# Patient Record
Sex: Female | Born: 1945 | Race: White | Hispanic: No | Marital: Married | Smoking: Never smoker
Health system: Southern US, Community
[De-identification: ages and names within clinical notes are randomized; demographics above are authoritative.]

## PROBLEM LIST (undated history)

## (undated) DIAGNOSIS — I10 Essential (primary) hypertension: Secondary | ICD-10-CM

## (undated) DIAGNOSIS — E669 Obesity, unspecified: Secondary | ICD-10-CM

## (undated) DIAGNOSIS — C4491 Basal cell carcinoma of skin, unspecified: Secondary | ICD-10-CM

## (undated) HISTORY — DX: Basal cell carcinoma of skin, unspecified: C44.91

## (undated) HISTORY — DX: Obesity, unspecified: E66.9

## (undated) HISTORY — DX: Essential (primary) hypertension: I10

---

## 1997-11-26 HISTORY — PX: CHOLECYSTECTOMY: SHX55

## 1998-06-22 ENCOUNTER — Other Ambulatory Visit: Admission: RE | Admit: 1998-06-22 | Discharge: 1998-06-22 | Payer: Self-pay | Admitting: Obstetrics and Gynecology

## 1999-01-11 ENCOUNTER — Encounter: Payer: Self-pay | Admitting: Obstetrics and Gynecology

## 1999-01-11 ENCOUNTER — Ambulatory Visit (HOSPITAL_COMMUNITY): Admission: RE | Admit: 1999-01-11 | Discharge: 1999-01-11 | Payer: Self-pay | Admitting: Obstetrics and Gynecology

## 2000-01-15 ENCOUNTER — Encounter: Payer: Self-pay | Admitting: Obstetrics and Gynecology

## 2000-01-15 ENCOUNTER — Ambulatory Visit (HOSPITAL_COMMUNITY): Admission: RE | Admit: 2000-01-15 | Discharge: 2000-01-15 | Payer: Self-pay | Admitting: Obstetrics and Gynecology

## 2001-03-06 ENCOUNTER — Encounter: Payer: Self-pay | Admitting: Obstetrics and Gynecology

## 2001-03-06 ENCOUNTER — Ambulatory Visit (HOSPITAL_COMMUNITY): Admission: RE | Admit: 2001-03-06 | Discharge: 2001-03-06 | Payer: Self-pay | Admitting: Obstetrics and Gynecology

## 2001-03-10 ENCOUNTER — Encounter: Payer: Self-pay | Admitting: Obstetrics and Gynecology

## 2001-03-10 ENCOUNTER — Encounter: Admission: RE | Admit: 2001-03-10 | Discharge: 2001-03-10 | Payer: Self-pay | Admitting: Obstetrics and Gynecology

## 2001-06-17 ENCOUNTER — Ambulatory Visit (HOSPITAL_COMMUNITY): Admission: RE | Admit: 2001-06-17 | Discharge: 2001-06-17 | Payer: Self-pay | Admitting: Obstetrics and Gynecology

## 2001-06-17 ENCOUNTER — Encounter (INDEPENDENT_AMBULATORY_CARE_PROVIDER_SITE_OTHER): Payer: Self-pay | Admitting: Specialist

## 2002-03-11 ENCOUNTER — Encounter: Payer: Self-pay | Admitting: Obstetrics and Gynecology

## 2002-03-11 ENCOUNTER — Ambulatory Visit (HOSPITAL_COMMUNITY): Admission: RE | Admit: 2002-03-11 | Discharge: 2002-03-11 | Payer: Self-pay | Admitting: Obstetrics and Gynecology

## 2003-03-31 ENCOUNTER — Ambulatory Visit (HOSPITAL_COMMUNITY): Admission: RE | Admit: 2003-03-31 | Discharge: 2003-03-31 | Payer: Self-pay | Admitting: Obstetrics and Gynecology

## 2003-03-31 ENCOUNTER — Encounter: Payer: Self-pay | Admitting: Obstetrics and Gynecology

## 2004-05-23 ENCOUNTER — Ambulatory Visit (HOSPITAL_COMMUNITY): Admission: RE | Admit: 2004-05-23 | Discharge: 2004-05-23 | Payer: Self-pay | Admitting: Obstetrics and Gynecology

## 2005-06-13 ENCOUNTER — Ambulatory Visit (HOSPITAL_COMMUNITY): Admission: RE | Admit: 2005-06-13 | Discharge: 2005-06-13 | Payer: Self-pay | Admitting: Obstetrics and Gynecology

## 2006-07-12 ENCOUNTER — Ambulatory Visit (HOSPITAL_COMMUNITY): Admission: RE | Admit: 2006-07-12 | Discharge: 2006-07-12 | Payer: Self-pay | Admitting: Obstetrics and Gynecology

## 2007-01-30 ENCOUNTER — Other Ambulatory Visit: Admission: RE | Admit: 2007-01-30 | Discharge: 2007-01-30 | Payer: Self-pay | Admitting: Family Medicine

## 2007-02-03 ENCOUNTER — Ambulatory Visit (HOSPITAL_COMMUNITY): Admission: RE | Admit: 2007-02-03 | Discharge: 2007-02-03 | Payer: Self-pay | Admitting: Family Medicine

## 2007-07-18 ENCOUNTER — Ambulatory Visit (HOSPITAL_COMMUNITY): Admission: RE | Admit: 2007-07-18 | Discharge: 2007-07-18 | Payer: Self-pay | Admitting: Family Medicine

## 2008-07-23 ENCOUNTER — Ambulatory Visit (HOSPITAL_COMMUNITY): Admission: RE | Admit: 2008-07-23 | Discharge: 2008-07-23 | Payer: Self-pay | Admitting: Family Medicine

## 2009-07-26 ENCOUNTER — Ambulatory Visit (HOSPITAL_COMMUNITY): Admission: RE | Admit: 2009-07-26 | Discharge: 2009-07-26 | Payer: Self-pay | Admitting: Family Medicine

## 2010-09-01 ENCOUNTER — Ambulatory Visit (HOSPITAL_COMMUNITY): Admission: RE | Admit: 2010-09-01 | Discharge: 2010-09-01 | Payer: Self-pay | Admitting: Family Medicine

## 2010-12-17 ENCOUNTER — Encounter: Payer: Self-pay | Admitting: Obstetrics and Gynecology

## 2010-12-20 ENCOUNTER — Other Ambulatory Visit: Payer: Self-pay | Admitting: Family Medicine

## 2010-12-20 ENCOUNTER — Other Ambulatory Visit
Admission: RE | Admit: 2010-12-20 | Discharge: 2010-12-20 | Payer: Self-pay | Source: Home / Self Care | Admitting: Family Medicine

## 2011-04-13 NOTE — Op Note (Signed)
[facility]  Patient:    JAKAIYA, [place]                       MRN: [id number] Proc. Date: 06/17/01 Attending:  Katherine Roan, M.D.                           Operative Report  PREOPERATIVE DIAGNOSIS:  Postmenopausal vaginal bleeding, persistent.  POSTOPERATIVE DIAGNOSIS:  Postmenopausal vaginal bleeding, persistent.  OPERATION:  Hysteroscopy with endometrial resection.  DESCRIPTION OF PROCEDURE:  The patient was placed in lithotomy position and prepped and draped in the usual fashion.  The cervix was dilated, and the hysteroscope was inserted into the uterus.  A 365 degree gentle resection of an irregular uterine cavity was performed.  All of the resected material was sent to the lab for study.  The uterus was injected with about 30 cc of a Pitressin solution consisting of 5 units in 100 cc of saline.  There were no complications or unusual blood loss.  Miriam tolerated this procedure well. DD:  06/17/01 TD:  06/17/01 Job: [id number]

## 2011-07-27 ENCOUNTER — Other Ambulatory Visit (HOSPITAL_COMMUNITY): Payer: Self-pay | Admitting: Family Medicine

## 2011-07-27 DIAGNOSIS — Z1231 Encounter for screening mammogram for malignant neoplasm of breast: Secondary | ICD-10-CM

## 2011-09-03 ENCOUNTER — Ambulatory Visit (HOSPITAL_COMMUNITY)
Admission: RE | Admit: 2011-09-03 | Discharge: 2011-09-03 | Disposition: A | Discharge status: Home / Self Care | Payer: Medicare Other | Source: Ambulatory Visit | Attending: Family Medicine | Admitting: Family Medicine

## 2011-09-03 DIAGNOSIS — Z1231 Encounter for screening mammogram for malignant neoplasm of breast: Secondary | ICD-10-CM | POA: Insufficient documentation

## 2011-12-25 DIAGNOSIS — I10 Essential (primary) hypertension: Secondary | ICD-10-CM | POA: Diagnosis not present

## 2011-12-25 DIAGNOSIS — E782 Mixed hyperlipidemia: Secondary | ICD-10-CM | POA: Diagnosis not present

## 2011-12-25 DIAGNOSIS — B372 Candidiasis of skin and nail: Secondary | ICD-10-CM | POA: Diagnosis not present

## 2011-12-25 DIAGNOSIS — R609 Edema, unspecified: Secondary | ICD-10-CM | POA: Diagnosis not present

## 2011-12-25 DIAGNOSIS — Z01419 Encounter for gynecological examination (general) (routine) without abnormal findings: Secondary | ICD-10-CM | POA: Diagnosis not present

## 2011-12-25 DIAGNOSIS — Z0000 Encounter for general adult medical examination without abnormal findings: Secondary | ICD-10-CM | POA: Diagnosis not present

## 2011-12-25 DIAGNOSIS — R809 Proteinuria, unspecified: Secondary | ICD-10-CM | POA: Diagnosis not present

## 2011-12-25 DIAGNOSIS — E559 Vitamin D deficiency, unspecified: Secondary | ICD-10-CM | POA: Diagnosis not present

## 2012-01-04 DIAGNOSIS — I10 Essential (primary) hypertension: Secondary | ICD-10-CM | POA: Diagnosis not present

## 2012-01-04 DIAGNOSIS — L538 Other specified erythematous conditions: Secondary | ICD-10-CM | POA: Diagnosis not present

## 2012-02-01 DIAGNOSIS — I10 Essential (primary) hypertension: Secondary | ICD-10-CM | POA: Diagnosis not present

## 2012-02-01 DIAGNOSIS — M79609 Pain in unspecified limb: Secondary | ICD-10-CM | POA: Diagnosis not present

## 2012-03-03 DIAGNOSIS — I10 Essential (primary) hypertension: Secondary | ICD-10-CM | POA: Diagnosis not present

## 2012-03-04 DIAGNOSIS — I10 Essential (primary) hypertension: Secondary | ICD-10-CM | POA: Diagnosis not present

## 2012-03-04 DIAGNOSIS — E669 Obesity, unspecified: Secondary | ICD-10-CM | POA: Diagnosis not present

## 2012-03-11 DIAGNOSIS — Z85828 Personal history of other malignant neoplasm of skin: Secondary | ICD-10-CM | POA: Diagnosis not present

## 2012-03-11 DIAGNOSIS — L408 Other psoriasis: Secondary | ICD-10-CM | POA: Diagnosis not present

## 2012-04-08 DIAGNOSIS — E669 Obesity, unspecified: Secondary | ICD-10-CM | POA: Diagnosis not present

## 2012-04-08 DIAGNOSIS — I10 Essential (primary) hypertension: Secondary | ICD-10-CM | POA: Diagnosis not present

## 2012-04-25 DIAGNOSIS — E559 Vitamin D deficiency, unspecified: Secondary | ICD-10-CM | POA: Diagnosis not present

## 2012-04-29 DIAGNOSIS — I10 Essential (primary) hypertension: Secondary | ICD-10-CM | POA: Diagnosis not present

## 2012-04-29 DIAGNOSIS — E669 Obesity, unspecified: Secondary | ICD-10-CM | POA: Diagnosis not present

## 2012-05-27 DIAGNOSIS — L408 Other psoriasis: Secondary | ICD-10-CM | POA: Diagnosis not present

## 2012-05-27 DIAGNOSIS — L219 Seborrheic dermatitis, unspecified: Secondary | ICD-10-CM | POA: Diagnosis not present

## 2012-08-11 DIAGNOSIS — E559 Vitamin D deficiency, unspecified: Secondary | ICD-10-CM | POA: Diagnosis not present

## 2012-08-11 DIAGNOSIS — R609 Edema, unspecified: Secondary | ICD-10-CM | POA: Diagnosis not present

## 2012-08-11 DIAGNOSIS — I10 Essential (primary) hypertension: Secondary | ICD-10-CM | POA: Diagnosis not present

## 2012-08-11 DIAGNOSIS — R809 Proteinuria, unspecified: Secondary | ICD-10-CM | POA: Diagnosis not present

## 2012-08-11 DIAGNOSIS — B372 Candidiasis of skin and nail: Secondary | ICD-10-CM | POA: Diagnosis not present

## 2012-08-11 DIAGNOSIS — E782 Mixed hyperlipidemia: Secondary | ICD-10-CM | POA: Diagnosis not present

## 2012-08-11 DIAGNOSIS — Z0000 Encounter for general adult medical examination without abnormal findings: Secondary | ICD-10-CM | POA: Diagnosis not present

## 2012-08-11 DIAGNOSIS — M79609 Pain in unspecified limb: Secondary | ICD-10-CM | POA: Diagnosis not present

## 2012-08-20 ENCOUNTER — Other Ambulatory Visit (HOSPITAL_COMMUNITY): Payer: Self-pay | Admitting: Family Medicine

## 2012-08-20 DIAGNOSIS — Z1231 Encounter for screening mammogram for malignant neoplasm of breast: Secondary | ICD-10-CM

## 2012-09-03 ENCOUNTER — Ambulatory Visit (HOSPITAL_COMMUNITY)
Admission: RE | Admit: 2012-09-03 | Discharge: 2012-09-03 | Disposition: A | Discharge status: Home / Self Care | Payer: Medicare Other | Source: Ambulatory Visit | Attending: Family Medicine | Admitting: Family Medicine

## 2012-09-03 DIAGNOSIS — Z1231 Encounter for screening mammogram for malignant neoplasm of breast: Secondary | ICD-10-CM | POA: Insufficient documentation

## 2012-12-19 DIAGNOSIS — R809 Proteinuria, unspecified: Secondary | ICD-10-CM | POA: Diagnosis not present

## 2012-12-19 DIAGNOSIS — M79609 Pain in unspecified limb: Secondary | ICD-10-CM | POA: Diagnosis not present

## 2012-12-19 DIAGNOSIS — I10 Essential (primary) hypertension: Secondary | ICD-10-CM | POA: Diagnosis not present

## 2012-12-19 DIAGNOSIS — B372 Candidiasis of skin and nail: Secondary | ICD-10-CM | POA: Diagnosis not present

## 2012-12-19 DIAGNOSIS — Z0000 Encounter for general adult medical examination without abnormal findings: Secondary | ICD-10-CM | POA: Diagnosis not present

## 2012-12-19 DIAGNOSIS — E559 Vitamin D deficiency, unspecified: Secondary | ICD-10-CM | POA: Diagnosis not present

## 2012-12-19 DIAGNOSIS — E782 Mixed hyperlipidemia: Secondary | ICD-10-CM | POA: Diagnosis not present

## 2012-12-19 DIAGNOSIS — R609 Edema, unspecified: Secondary | ICD-10-CM | POA: Diagnosis not present

## 2012-12-26 DIAGNOSIS — E559 Vitamin D deficiency, unspecified: Secondary | ICD-10-CM | POA: Diagnosis not present

## 2012-12-26 DIAGNOSIS — Z01419 Encounter for gynecological examination (general) (routine) without abnormal findings: Secondary | ICD-10-CM | POA: Diagnosis not present

## 2012-12-26 DIAGNOSIS — I10 Essential (primary) hypertension: Secondary | ICD-10-CM | POA: Diagnosis not present

## 2012-12-26 DIAGNOSIS — E782 Mixed hyperlipidemia: Secondary | ICD-10-CM | POA: Diagnosis not present

## 2012-12-26 DIAGNOSIS — B372 Candidiasis of skin and nail: Secondary | ICD-10-CM | POA: Diagnosis not present

## 2012-12-26 DIAGNOSIS — Z1331 Encounter for screening for depression: Secondary | ICD-10-CM | POA: Diagnosis not present

## 2012-12-26 DIAGNOSIS — Z0000 Encounter for general adult medical examination without abnormal findings: Secondary | ICD-10-CM | POA: Diagnosis not present

## 2013-01-20 DIAGNOSIS — R21 Rash and other nonspecific skin eruption: Secondary | ICD-10-CM | POA: Diagnosis not present

## 2013-01-20 DIAGNOSIS — I10 Essential (primary) hypertension: Secondary | ICD-10-CM | POA: Diagnosis not present

## 2013-01-22 DIAGNOSIS — L538 Other specified erythematous conditions: Secondary | ICD-10-CM | POA: Diagnosis not present

## 2013-01-22 DIAGNOSIS — L0889 Other specified local infections of the skin and subcutaneous tissue: Secondary | ICD-10-CM | POA: Diagnosis not present

## 2013-03-12 ENCOUNTER — Other Ambulatory Visit: Payer: Self-pay | Admitting: Dermatology

## 2013-03-12 DIAGNOSIS — C44319 Basal cell carcinoma of skin of other parts of face: Secondary | ICD-10-CM | POA: Diagnosis not present

## 2013-03-12 DIAGNOSIS — L408 Other psoriasis: Secondary | ICD-10-CM | POA: Diagnosis not present

## 2013-03-12 DIAGNOSIS — L570 Actinic keratosis: Secondary | ICD-10-CM | POA: Diagnosis not present

## 2013-03-12 DIAGNOSIS — L219 Seborrheic dermatitis, unspecified: Secondary | ICD-10-CM | POA: Diagnosis not present

## 2013-03-12 DIAGNOSIS — D485 Neoplasm of uncertain behavior of skin: Secondary | ICD-10-CM | POA: Diagnosis not present

## 2013-03-12 DIAGNOSIS — Z85828 Personal history of other malignant neoplasm of skin: Secondary | ICD-10-CM | POA: Diagnosis not present

## 2013-03-12 DIAGNOSIS — L820 Inflamed seborrheic keratosis: Secondary | ICD-10-CM | POA: Diagnosis not present

## 2013-04-06 DIAGNOSIS — C44319 Basal cell carcinoma of skin of other parts of face: Secondary | ICD-10-CM | POA: Diagnosis not present

## 2013-05-19 DIAGNOSIS — E669 Obesity, unspecified: Secondary | ICD-10-CM | POA: Diagnosis not present

## 2013-05-19 DIAGNOSIS — I10 Essential (primary) hypertension: Secondary | ICD-10-CM | POA: Diagnosis not present

## 2013-07-28 DIAGNOSIS — I10 Essential (primary) hypertension: Secondary | ICD-10-CM | POA: Diagnosis not present

## 2013-07-28 DIAGNOSIS — E782 Mixed hyperlipidemia: Secondary | ICD-10-CM | POA: Diagnosis not present

## 2013-07-28 DIAGNOSIS — E669 Obesity, unspecified: Secondary | ICD-10-CM | POA: Diagnosis not present

## 2013-07-28 DIAGNOSIS — R809 Proteinuria, unspecified: Secondary | ICD-10-CM | POA: Diagnosis not present

## 2013-07-28 DIAGNOSIS — E559 Vitamin D deficiency, unspecified: Secondary | ICD-10-CM | POA: Diagnosis not present

## 2013-07-28 DIAGNOSIS — Z23 Encounter for immunization: Secondary | ICD-10-CM | POA: Diagnosis not present

## 2013-08-05 ENCOUNTER — Other Ambulatory Visit (HOSPITAL_COMMUNITY): Payer: Self-pay | Admitting: Family Medicine

## 2013-08-05 DIAGNOSIS — Z1231 Encounter for screening mammogram for malignant neoplasm of breast: Secondary | ICD-10-CM

## 2013-09-04 ENCOUNTER — Ambulatory Visit (HOSPITAL_COMMUNITY)
Admission: RE | Admit: 2013-09-04 | Discharge: 2013-09-04 | Disposition: A | Discharge status: Home / Self Care | Payer: Medicare Other | Source: Ambulatory Visit | Attending: Family Medicine | Admitting: Family Medicine

## 2013-09-04 DIAGNOSIS — Z1231 Encounter for screening mammogram for malignant neoplasm of breast: Secondary | ICD-10-CM | POA: Insufficient documentation

## 2013-09-15 DIAGNOSIS — L408 Other psoriasis: Secondary | ICD-10-CM | POA: Diagnosis not present

## 2013-09-15 DIAGNOSIS — L219 Seborrheic dermatitis, unspecified: Secondary | ICD-10-CM | POA: Diagnosis not present

## 2013-09-15 DIAGNOSIS — Z85828 Personal history of other malignant neoplasm of skin: Secondary | ICD-10-CM | POA: Diagnosis not present

## 2013-09-15 DIAGNOSIS — L821 Other seborrheic keratosis: Secondary | ICD-10-CM | POA: Diagnosis not present

## 2013-11-10 ENCOUNTER — Telehealth: Payer: Self-pay

## 2013-11-10 MED ORDER — NEBIVOLOL HCL 20 MG PO TABS: 20.0000 mg | ORAL_TABLET | Freq: Every day | ORAL | Status: DC

## 2013-11-10 NOTE — Telephone Encounter (Signed)
Refilled

## 2013-11-16 ENCOUNTER — Other Ambulatory Visit: Payer: Self-pay

## 2013-11-16 MED ORDER — NEBIVOLOL HCL 10 MG PO TABS: ORAL_TABLET | ORAL | Status: DC

## 2014-01-26 ENCOUNTER — Other Ambulatory Visit: Payer: Self-pay | Admitting: Family Medicine

## 2014-01-26 DIAGNOSIS — L259 Unspecified contact dermatitis, unspecified cause: Secondary | ICD-10-CM | POA: Diagnosis not present

## 2014-01-26 DIAGNOSIS — Z0000 Encounter for general adult medical examination without abnormal findings: Secondary | ICD-10-CM | POA: Insufficient documentation

## 2014-01-26 DIAGNOSIS — B372 Candidiasis of skin and nail: Secondary | ICD-10-CM | POA: Diagnosis not present

## 2014-01-26 DIAGNOSIS — Z1151 Encounter for screening for human papillomavirus (HPV): Secondary | ICD-10-CM | POA: Insufficient documentation

## 2014-01-26 DIAGNOSIS — E782 Mixed hyperlipidemia: Secondary | ICD-10-CM | POA: Diagnosis not present

## 2014-01-26 DIAGNOSIS — Z1331 Encounter for screening for depression: Secondary | ICD-10-CM | POA: Diagnosis not present

## 2014-01-26 DIAGNOSIS — B9789 Other viral agents as the cause of diseases classified elsewhere: Secondary | ICD-10-CM | POA: Diagnosis not present

## 2014-01-26 DIAGNOSIS — I10 Essential (primary) hypertension: Secondary | ICD-10-CM | POA: Diagnosis not present

## 2014-01-26 DIAGNOSIS — E559 Vitamin D deficiency, unspecified: Secondary | ICD-10-CM | POA: Diagnosis not present

## 2014-01-26 DIAGNOSIS — Z124 Encounter for screening for malignant neoplasm of cervix: Secondary | ICD-10-CM | POA: Diagnosis not present

## 2014-01-28 ENCOUNTER — Other Ambulatory Visit (HOSPITAL_COMMUNITY)
Admission: RE | Admit: 2014-01-28 | Discharge: 2014-01-28 | Disposition: A | Discharge status: Home / Self Care | Payer: Medicare Other | Source: Ambulatory Visit | Attending: Family Medicine | Admitting: Family Medicine

## 2014-03-30 ENCOUNTER — Encounter: Payer: Self-pay | Admitting: Interventional Cardiology

## 2014-05-18 ENCOUNTER — Ambulatory Visit: Payer: 59 | Admitting: Interventional Cardiology

## 2014-05-18 ENCOUNTER — Ambulatory Visit: Payer: 59 | Admitting: Cardiology

## 2014-05-21 ENCOUNTER — Ambulatory Visit: Payer: 59 | Admitting: Interventional Cardiology

## 2014-05-25 ENCOUNTER — Encounter: Payer: Self-pay | Admitting: Cardiology

## 2014-05-25 DIAGNOSIS — E782 Mixed hyperlipidemia: Secondary | ICD-10-CM

## 2014-05-25 DIAGNOSIS — I10 Essential (primary) hypertension: Secondary | ICD-10-CM | POA: Insufficient documentation

## 2014-05-25 DIAGNOSIS — E669 Obesity, unspecified: Secondary | ICD-10-CM | POA: Insufficient documentation

## 2014-06-03 ENCOUNTER — Encounter: Payer: Self-pay | Admitting: Interventional Cardiology

## 2014-06-03 ENCOUNTER — Ambulatory Visit (INDEPENDENT_AMBULATORY_CARE_PROVIDER_SITE_OTHER): Payer: Medicare Other | Admitting: Interventional Cardiology

## 2014-06-03 VITALS — BP 144/82 | HR 48 | Ht 64.5 in | Wt 188.8 lb

## 2014-06-03 DIAGNOSIS — I10 Essential (primary) hypertension: Secondary | ICD-10-CM | POA: Diagnosis not present

## 2014-06-03 MED ORDER — AMLODIPINE BESYLATE 5 MG PO TABS: ORAL_TABLET | ORAL | Status: AC

## 2014-06-03 MED ORDER — NEBIVOLOL HCL 10 MG PO TABS: ORAL_TABLET | ORAL | Status: AC

## 2014-06-03 NOTE — Progress Notes (Signed)
Patient ID: Jenny Hayes, female   DOB: September 03, 1946, 68 y.o.   MRN: [id number]    [address] [place], [place]  [address] Phone: [phone] Fax:  [phone]  Date:  06/03/2014   ID:  Jenny Hayes, DOB [birth year], MRN [id number]  PCP:  No primary provider on file.      History of Present Illness: Jenny Hayes is a 68 y.o. female with difficult to control BP. At home, BPs have been in the 130/70 range. Swelling is improved. She is still taking the amlodipine. Biggest improvement was with changing micardis to zestril.  No CP, SHOB. Not much walking. She has been travelling. She is happy her BP is better. SHe does have some fatigue. She thinks it may be related to the Bystolic.  HR can be low at home, in the 50s on the monitor.    Wt Readings from Last 3 Encounters:  06/03/14 188 lb 12.8 oz (85.639 kg)     Past Medical History  Diagnosis Date  . Essential hypertension, benign   . Obesity, unspecified   . Basal cell carcinoma     right upper shoulder 7/10    Current Outpatient Prescriptions  Medication Sig Dispense Refill  . amLODipine (NORVASC) 2.5 MG tablet Take 2.5 mg by mouth daily.      . Calcium Carb-Cholecalciferol (CALCIUM 1000 + D PO) Take by mouth.      . cetirizine (ZYRTEC) 10 MG tablet Take 10 mg by mouth daily.      . cholecalciferol (VITAMIN D) 1000 UNITS tablet Take 2,000 Units by mouth 2 (two) times daily.      Marland Kitchen lisinopril (PRINIVIL,ZESTRIL) 40 MG tablet Take 40 mg by mouth daily.      . nebivolol (BYSTOLIC) 10 MG tablet Take two tablets daily  60 tablet  3  . nystatin (MYCOSTATIN) powder Apply topically 4 (four) times daily.       No current facility-administered medications for this visit.    Allergies:    Allergies  Allergen Reactions  . Amlodipine     edema  . Metoprolol Succinate [Metoprolol] Hives    urticaria  . Sulfa Antibiotics   . Hctz [Hydrochlorothiazide] Rash    Social History:  The patient  reports that she has  never smoked. She does not have any smokeless tobacco history on file. She reports that she drinks alcohol. She reports that she does not use illicit drugs.   Family History:  The patient's family history includes Breast cancer in her mother; CAD in her father; CVA in her father and paternal grandmother; Hypertension in her brother and brother; Lung cancer in her mother.   ROS:  Please see the history of present illness.  No nausea, vomiting.  No fevers, chills.  No focal weakness.  No dysuria.Fatigue.  Not walking much outside but watches a 53 month old baby.   All other systems reviewed and negative.   PHYSICAL EXAM: VS:  BP 144/82  Pulse 48  Ht 5' 4.5" (1.638 m)  Wt 188 lb 12.8 oz (85.639 kg)  BMI 31.92 kg/m2 Well nourished, well developed, in no acute distress HEENT: normal Neck: no JVD, no carotid bruits Cardiac:  normal S1, S2; bradycardic Lungs:  clear to auscultation bilaterally, no wheezing, rhonchi or rales Abd: soft, nontender, no hepatomegaly Ext: no edema Skin: warm and dry Neuro:   no focal abnormalities noted  EKG:  Sinus bradycardia, NSST    ASSESSMENT AND PLAN:  Essential hypertension, benign  Increase Amlodipine Besylate Tablet, 5 MG, 1 tablet, Orally, Once a day, 30 day(s), 90, Refills 3 Decrease Bystolic Tablet, 10 MG, 1 tabs, Orally, once a day, 90, Refills 3 Continue Lasix Tablet, 20 MG, 1/2- 1 tablet, Orally, prn, 90, Refills 3 Continue Zestril Tablet, 40 MG, 1 tablet, Orally, Once a day, 90 day(s), 90, Refills 3 Notes: Lasix to help with edema from amlodipine. Zestril worked [employer] in the past than Micardis, and is working better. We also discussed the importance of lifestyle modification like decreasing salt intake, regular exercise and weight loss. Edema has been better. If edema becomes intolerable, could switch to diltiazem.  Prior edema with 10 mg of amlodipine. 2. Obesity  Notes: We spoke at length about setting small goals or weight loss. Ultimately,  she wants to get down to 150 pounds.   3. LDL 117.     Signed, Mina Marble, MD, [facility] 06/03/2014 10:30 AM

## 2014-06-03 NOTE — Patient Instructions (Signed)
Your physician has recommended you make the following change in your medication:   1. Increase Amlodipine to 5 mg daily.   2. Decrease Bystolic to 10 mg daily.   Your physician has requested that you regularly monitor and record your blood pressure readings at home. Please use the same machine at the same time of day to check your readings and record them. Call if you have higher BP readings consistently above 140/90. Also call if you have low BP readings below 100 on the top number. If you have slow HR's below 60 bpm with dizziness please call us to let us know.   Your physician wants you to follow-up in: 1 year with Dr. Irish Lack. You will receive a reminder letter in the mail two months in advance. If you don't receive a letter, please call our office to schedule the follow-up appointment.

## 2014-08-16 ENCOUNTER — Other Ambulatory Visit (HOSPITAL_COMMUNITY): Payer: Self-pay | Admitting: Family Medicine

## 2014-08-16 DIAGNOSIS — Z1231 Encounter for screening mammogram for malignant neoplasm of breast: Secondary | ICD-10-CM

## 2014-08-18 DIAGNOSIS — H5210 Myopia, unspecified eye: Secondary | ICD-10-CM | POA: Diagnosis not present

## 2014-08-18 DIAGNOSIS — H2510 Age-related nuclear cataract, unspecified eye: Secondary | ICD-10-CM | POA: Diagnosis not present

## 2014-08-18 DIAGNOSIS — H35039 Hypertensive retinopathy, unspecified eye: Secondary | ICD-10-CM | POA: Diagnosis not present

## 2014-08-18 DIAGNOSIS — H5340 Unspecified visual field defects: Secondary | ICD-10-CM | POA: Diagnosis not present

## 2014-09-06 ENCOUNTER — Ambulatory Visit (HOSPITAL_COMMUNITY)
Admission: RE | Admit: 2014-09-06 | Discharge: 2014-09-06 | Disposition: A | Discharge status: Home / Self Care | Payer: Medicare Other | Source: Ambulatory Visit | Attending: Family Medicine | Admitting: Family Medicine

## 2014-09-06 DIAGNOSIS — Z1231 Encounter for screening mammogram for malignant neoplasm of breast: Secondary | ICD-10-CM

## 2014-09-28 DIAGNOSIS — H18411 Arcus senilis, right eye: Secondary | ICD-10-CM | POA: Diagnosis not present

## 2014-09-28 DIAGNOSIS — H25041 Posterior subcapsular polar age-related cataract, right eye: Secondary | ICD-10-CM | POA: Diagnosis not present

## 2014-09-28 DIAGNOSIS — H2511 Age-related nuclear cataract, right eye: Secondary | ICD-10-CM | POA: Diagnosis not present

## 2014-09-28 DIAGNOSIS — H25011 Cortical age-related cataract, right eye: Secondary | ICD-10-CM | POA: Diagnosis not present

## 2014-10-25 DIAGNOSIS — H2512 Age-related nuclear cataract, left eye: Secondary | ICD-10-CM | POA: Diagnosis not present

## 2014-10-25 DIAGNOSIS — H2513 Age-related nuclear cataract, bilateral: Secondary | ICD-10-CM | POA: Diagnosis not present

## 2014-10-25 DIAGNOSIS — H259 Unspecified age-related cataract: Secondary | ICD-10-CM | POA: Diagnosis not present

## 2014-10-26 DIAGNOSIS — H2511 Age-related nuclear cataract, right eye: Secondary | ICD-10-CM | POA: Diagnosis not present

## 2014-10-26 DIAGNOSIS — H25041 Posterior subcapsular polar age-related cataract, right eye: Secondary | ICD-10-CM | POA: Diagnosis not present

## 2014-10-26 DIAGNOSIS — H25011 Cortical age-related cataract, right eye: Secondary | ICD-10-CM | POA: Diagnosis not present

## 2014-11-05 DIAGNOSIS — H2513 Age-related nuclear cataract, bilateral: Secondary | ICD-10-CM | POA: Diagnosis not present

## 2014-11-05 DIAGNOSIS — H25811 Combined forms of age-related cataract, right eye: Secondary | ICD-10-CM | POA: Diagnosis not present

## 2014-11-05 DIAGNOSIS — H2511 Age-related nuclear cataract, right eye: Secondary | ICD-10-CM | POA: Diagnosis not present

## 2015-01-20 DIAGNOSIS — E782 Mixed hyperlipidemia: Secondary | ICD-10-CM | POA: Diagnosis not present

## 2015-01-20 DIAGNOSIS — I10 Essential (primary) hypertension: Secondary | ICD-10-CM | POA: Diagnosis not present

## 2015-01-20 DIAGNOSIS — E559 Vitamin D deficiency, unspecified: Secondary | ICD-10-CM | POA: Diagnosis not present

## 2015-01-28 DIAGNOSIS — Z0000 Encounter for general adult medical examination without abnormal findings: Secondary | ICD-10-CM | POA: Diagnosis not present

## 2015-01-28 DIAGNOSIS — Z1389 Encounter for screening for other disorder: Secondary | ICD-10-CM | POA: Diagnosis not present

## 2015-01-28 DIAGNOSIS — E559 Vitamin D deficiency, unspecified: Secondary | ICD-10-CM | POA: Diagnosis not present

## 2015-01-28 DIAGNOSIS — R001 Bradycardia, unspecified: Secondary | ICD-10-CM | POA: Diagnosis not present

## 2015-01-28 DIAGNOSIS — L309 Dermatitis, unspecified: Secondary | ICD-10-CM | POA: Diagnosis not present

## 2015-01-28 DIAGNOSIS — I10 Essential (primary) hypertension: Secondary | ICD-10-CM | POA: Diagnosis not present

## 2015-01-28 DIAGNOSIS — E782 Mixed hyperlipidemia: Secondary | ICD-10-CM | POA: Diagnosis not present

## 2015-01-28 DIAGNOSIS — Z124 Encounter for screening for malignant neoplasm of cervix: Secondary | ICD-10-CM | POA: Diagnosis not present

## 2015-01-28 DIAGNOSIS — Z23 Encounter for immunization: Secondary | ICD-10-CM | POA: Diagnosis not present

## 2015-01-28 DIAGNOSIS — E669 Obesity, unspecified: Secondary | ICD-10-CM | POA: Diagnosis not present

## 2015-01-28 DIAGNOSIS — R7301 Impaired fasting glucose: Secondary | ICD-10-CM | POA: Diagnosis not present

## 2015-03-01 DIAGNOSIS — R001 Bradycardia, unspecified: Secondary | ICD-10-CM | POA: Diagnosis not present

## 2015-03-01 DIAGNOSIS — I10 Essential (primary) hypertension: Secondary | ICD-10-CM | POA: Diagnosis not present

## 2015-04-22 DIAGNOSIS — Z09 Encounter for follow-up examination after completed treatment for conditions other than malignant neoplasm: Secondary | ICD-10-CM | POA: Diagnosis not present

## 2015-04-22 DIAGNOSIS — E559 Vitamin D deficiency, unspecified: Secondary | ICD-10-CM | POA: Diagnosis not present

## 2015-04-22 DIAGNOSIS — E669 Obesity, unspecified: Secondary | ICD-10-CM | POA: Diagnosis not present

## 2015-04-22 DIAGNOSIS — E782 Mixed hyperlipidemia: Secondary | ICD-10-CM | POA: Diagnosis not present

## 2015-04-22 DIAGNOSIS — Z23 Encounter for immunization: Secondary | ICD-10-CM | POA: Diagnosis not present

## 2015-04-22 DIAGNOSIS — Z0000 Encounter for general adult medical examination without abnormal findings: Secondary | ICD-10-CM | POA: Diagnosis not present

## 2015-04-22 DIAGNOSIS — I10 Essential (primary) hypertension: Secondary | ICD-10-CM | POA: Diagnosis not present

## 2015-04-22 DIAGNOSIS — R7301 Impaired fasting glucose: Secondary | ICD-10-CM | POA: Diagnosis not present

## 2015-04-22 DIAGNOSIS — R001 Bradycardia, unspecified: Secondary | ICD-10-CM | POA: Diagnosis not present

## 2015-04-22 DIAGNOSIS — L309 Dermatitis, unspecified: Secondary | ICD-10-CM | POA: Diagnosis not present

## 2015-08-05 DIAGNOSIS — E782 Mixed hyperlipidemia: Secondary | ICD-10-CM | POA: Diagnosis not present

## 2015-08-05 DIAGNOSIS — I10 Essential (primary) hypertension: Secondary | ICD-10-CM | POA: Diagnosis not present

## 2015-08-05 DIAGNOSIS — E559 Vitamin D deficiency, unspecified: Secondary | ICD-10-CM | POA: Diagnosis not present

## 2015-08-05 DIAGNOSIS — L309 Dermatitis, unspecified: Secondary | ICD-10-CM | POA: Diagnosis not present

## 2015-08-05 DIAGNOSIS — J309 Allergic rhinitis, unspecified: Secondary | ICD-10-CM | POA: Diagnosis not present

## 2015-08-05 DIAGNOSIS — R7301 Impaired fasting glucose: Secondary | ICD-10-CM | POA: Diagnosis not present

## 2015-08-05 DIAGNOSIS — G629 Polyneuropathy, unspecified: Secondary | ICD-10-CM | POA: Diagnosis not present

## 2015-08-05 DIAGNOSIS — R208 Other disturbances of skin sensation: Secondary | ICD-10-CM | POA: Diagnosis not present

## 2015-08-23 ENCOUNTER — Other Ambulatory Visit: Payer: Self-pay

## 2015-08-23 DIAGNOSIS — H524 Presbyopia: Secondary | ICD-10-CM | POA: Diagnosis not present

## 2015-08-23 DIAGNOSIS — H5340 Unspecified visual field defects: Secondary | ICD-10-CM | POA: Diagnosis not present

## 2015-08-23 DIAGNOSIS — H35033 Hypertensive retinopathy, bilateral: Secondary | ICD-10-CM | POA: Diagnosis not present

## 2015-08-23 DIAGNOSIS — Z1231 Encounter for screening mammogram for malignant neoplasm of breast: Secondary | ICD-10-CM

## 2015-08-23 DIAGNOSIS — H2513 Age-related nuclear cataract, bilateral: Secondary | ICD-10-CM | POA: Diagnosis not present

## 2015-09-07 DIAGNOSIS — B372 Candidiasis of skin and nail: Secondary | ICD-10-CM | POA: Diagnosis not present

## 2015-09-07 DIAGNOSIS — E559 Vitamin D deficiency, unspecified: Secondary | ICD-10-CM | POA: Diagnosis not present

## 2015-09-07 DIAGNOSIS — Z23 Encounter for immunization: Secondary | ICD-10-CM | POA: Diagnosis not present

## 2015-09-07 DIAGNOSIS — I10 Essential (primary) hypertension: Secondary | ICD-10-CM | POA: Diagnosis not present

## 2015-09-10 DIAGNOSIS — Z23 Encounter for immunization: Secondary | ICD-10-CM | POA: Diagnosis not present

## 2015-09-14 ENCOUNTER — Ambulatory Visit: Payer: Self-pay

## 2015-09-23 ENCOUNTER — Other Ambulatory Visit: Payer: Self-pay | Admitting: Family Medicine

## 2015-09-23 MED ORDER — HYDROCODONE-ACETAMINOPHEN 5-325 MG PO TABS: 1.0000 | ORAL_TABLET | ORAL | Status: DC | PRN

## 2015-10-11 ENCOUNTER — Ambulatory Visit
Admission: RE | Admit: 2015-10-11 | Discharge: 2015-10-11 | Disposition: A | Discharge status: Home / Self Care | Payer: Medicare Other | Source: Ambulatory Visit

## 2015-10-11 DIAGNOSIS — Z1231 Encounter for screening mammogram for malignant neoplasm of breast: Secondary | ICD-10-CM | POA: Diagnosis not present

## 2016-02-07 DIAGNOSIS — Z1211 Encounter for screening for malignant neoplasm of colon: Secondary | ICD-10-CM | POA: Diagnosis not present

## 2016-02-07 DIAGNOSIS — I10 Essential (primary) hypertension: Secondary | ICD-10-CM | POA: Diagnosis not present

## 2016-02-07 DIAGNOSIS — R7301 Impaired fasting glucose: Secondary | ICD-10-CM | POA: Diagnosis not present

## 2016-02-07 DIAGNOSIS — B372 Candidiasis of skin and nail: Secondary | ICD-10-CM | POA: Diagnosis not present

## 2016-02-07 DIAGNOSIS — Z0000 Encounter for general adult medical examination without abnormal findings: Secondary | ICD-10-CM | POA: Diagnosis not present

## 2016-02-07 DIAGNOSIS — E559 Vitamin D deficiency, unspecified: Secondary | ICD-10-CM | POA: Diagnosis not present

## 2016-02-07 DIAGNOSIS — N959 Unspecified menopausal and perimenopausal disorder: Secondary | ICD-10-CM | POA: Diagnosis not present

## 2016-03-14 DIAGNOSIS — Z780 Asymptomatic menopausal state: Secondary | ICD-10-CM | POA: Diagnosis not present

## 2016-08-16 DIAGNOSIS — B372 Candidiasis of skin and nail: Secondary | ICD-10-CM | POA: Diagnosis not present

## 2016-08-16 DIAGNOSIS — R7301 Impaired fasting glucose: Secondary | ICD-10-CM | POA: Diagnosis not present

## 2016-08-16 DIAGNOSIS — I10 Essential (primary) hypertension: Secondary | ICD-10-CM | POA: Diagnosis not present

## 2016-08-16 DIAGNOSIS — E782 Mixed hyperlipidemia: Secondary | ICD-10-CM | POA: Diagnosis not present

## 2016-08-16 DIAGNOSIS — J309 Allergic rhinitis, unspecified: Secondary | ICD-10-CM | POA: Diagnosis not present

## 2016-08-16 DIAGNOSIS — Z23 Encounter for immunization: Secondary | ICD-10-CM | POA: Diagnosis not present

## 2016-08-16 DIAGNOSIS — E559 Vitamin D deficiency, unspecified: Secondary | ICD-10-CM | POA: Diagnosis not present

## 2016-08-16 DIAGNOSIS — L309 Dermatitis, unspecified: Secondary | ICD-10-CM | POA: Diagnosis not present

## 2016-09-11 ENCOUNTER — Other Ambulatory Visit: Payer: Self-pay | Admitting: Family Medicine

## 2016-09-11 DIAGNOSIS — Z1231 Encounter for screening mammogram for malignant neoplasm of breast: Secondary | ICD-10-CM

## 2016-10-03 DIAGNOSIS — Z23 Encounter for immunization: Secondary | ICD-10-CM | POA: Diagnosis not present

## 2016-10-11 ENCOUNTER — Ambulatory Visit: Payer: Self-pay

## 2016-10-16 ENCOUNTER — Ambulatory Visit
Admission: RE | Admit: 2016-10-16 | Discharge: 2016-10-16 | Disposition: A | Discharge status: Home / Self Care | Payer: Medicare Other | Source: Ambulatory Visit | Attending: Family Medicine | Admitting: Family Medicine

## 2016-10-16 DIAGNOSIS — Z1231 Encounter for screening mammogram for malignant neoplasm of breast: Secondary | ICD-10-CM

## 2017-02-06 DIAGNOSIS — R7301 Impaired fasting glucose: Secondary | ICD-10-CM | POA: Diagnosis not present

## 2017-02-06 DIAGNOSIS — I10 Essential (primary) hypertension: Secondary | ICD-10-CM | POA: Diagnosis not present

## 2017-02-06 DIAGNOSIS — N959 Unspecified menopausal and perimenopausal disorder: Secondary | ICD-10-CM | POA: Diagnosis not present

## 2017-02-06 DIAGNOSIS — Z0000 Encounter for general adult medical examination without abnormal findings: Secondary | ICD-10-CM | POA: Diagnosis not present

## 2017-02-06 DIAGNOSIS — B372 Candidiasis of skin and nail: Secondary | ICD-10-CM | POA: Diagnosis not present

## 2017-02-06 DIAGNOSIS — Z1211 Encounter for screening for malignant neoplasm of colon: Secondary | ICD-10-CM | POA: Diagnosis not present

## 2017-02-06 DIAGNOSIS — E559 Vitamin D deficiency, unspecified: Secondary | ICD-10-CM | POA: Diagnosis not present

## 2017-02-12 DIAGNOSIS — E559 Vitamin D deficiency, unspecified: Secondary | ICD-10-CM | POA: Diagnosis not present

## 2017-02-12 DIAGNOSIS — E782 Mixed hyperlipidemia: Secondary | ICD-10-CM | POA: Diagnosis not present

## 2017-02-12 DIAGNOSIS — I10 Essential (primary) hypertension: Secondary | ICD-10-CM | POA: Diagnosis not present

## 2017-02-12 DIAGNOSIS — J309 Allergic rhinitis, unspecified: Secondary | ICD-10-CM | POA: Diagnosis not present

## 2017-02-12 DIAGNOSIS — Z1211 Encounter for screening for malignant neoplasm of colon: Secondary | ICD-10-CM | POA: Diagnosis not present

## 2017-02-12 DIAGNOSIS — Z1159 Encounter for screening for other viral diseases: Secondary | ICD-10-CM | POA: Diagnosis not present

## 2017-02-12 DIAGNOSIS — Z0000 Encounter for general adult medical examination without abnormal findings: Secondary | ICD-10-CM | POA: Diagnosis not present

## 2017-02-12 DIAGNOSIS — R7301 Impaired fasting glucose: Secondary | ICD-10-CM | POA: Diagnosis not present

## 2017-02-12 DIAGNOSIS — Z01419 Encounter for gynecological examination (general) (routine) without abnormal findings: Secondary | ICD-10-CM | POA: Diagnosis not present

## 2017-02-12 DIAGNOSIS — Z1389 Encounter for screening for other disorder: Secondary | ICD-10-CM | POA: Diagnosis not present

## 2017-02-12 DIAGNOSIS — L309 Dermatitis, unspecified: Secondary | ICD-10-CM | POA: Diagnosis not present

## 2017-04-09 DIAGNOSIS — Z1211 Encounter for screening for malignant neoplasm of colon: Secondary | ICD-10-CM | POA: Diagnosis not present

## 2017-06-17 ENCOUNTER — Telehealth: Payer: Self-pay | Admitting: *Deleted

## 2017-06-17 NOTE — Telephone Encounter (Signed)
CALLED PATIENT TO INFORM OF GOLD SEED PLACEMENT ON 07-15-17 - ARRIVAL TIME - 8:15 AM @ DR. WRENN'S OFFICE AND HIS SIM ON 07-18-17 @ 10 AM @ DR. MANNING'S OFFICE, SPOKE WITH PATIENT AND HE IS AWARE OF THESE APPTS.

## 2017-08-16 DIAGNOSIS — E782 Mixed hyperlipidemia: Secondary | ICD-10-CM | POA: Diagnosis not present

## 2017-08-16 DIAGNOSIS — R32 Unspecified urinary incontinence: Secondary | ICD-10-CM | POA: Diagnosis not present

## 2017-08-16 DIAGNOSIS — Z23 Encounter for immunization: Secondary | ICD-10-CM | POA: Diagnosis not present

## 2017-08-16 DIAGNOSIS — J309 Allergic rhinitis, unspecified: Secondary | ICD-10-CM | POA: Diagnosis not present

## 2017-08-16 DIAGNOSIS — R7301 Impaired fasting glucose: Secondary | ICD-10-CM | POA: Diagnosis not present

## 2017-08-16 DIAGNOSIS — I10 Essential (primary) hypertension: Secondary | ICD-10-CM | POA: Diagnosis not present

## 2017-08-16 DIAGNOSIS — M79671 Pain in right foot: Secondary | ICD-10-CM | POA: Diagnosis not present

## 2017-08-16 DIAGNOSIS — E559 Vitamin D deficiency, unspecified: Secondary | ICD-10-CM | POA: Diagnosis not present

## 2017-08-16 DIAGNOSIS — L309 Dermatitis, unspecified: Secondary | ICD-10-CM | POA: Diagnosis not present

## 2017-08-20 DIAGNOSIS — L282 Other prurigo: Secondary | ICD-10-CM | POA: Diagnosis not present

## 2017-08-20 DIAGNOSIS — L400 Psoriasis vulgaris: Secondary | ICD-10-CM | POA: Diagnosis not present

## 2017-08-23 DIAGNOSIS — Z23 Encounter for immunization: Secondary | ICD-10-CM | POA: Diagnosis not present

## 2017-09-06 ENCOUNTER — Other Ambulatory Visit: Payer: Self-pay | Admitting: Family Medicine

## 2017-09-06 DIAGNOSIS — Z1231 Encounter for screening mammogram for malignant neoplasm of breast: Secondary | ICD-10-CM

## 2017-09-17 DIAGNOSIS — L304 Erythema intertrigo: Secondary | ICD-10-CM | POA: Diagnosis not present

## 2017-09-17 DIAGNOSIS — L400 Psoriasis vulgaris: Secondary | ICD-10-CM | POA: Diagnosis not present

## 2017-10-22 ENCOUNTER — Ambulatory Visit
Admission: RE | Admit: 2017-10-22 | Discharge: 2017-10-22 | Disposition: A | Discharge status: Home / Self Care | Payer: Medicare Other | Source: Ambulatory Visit | Attending: Family Medicine | Admitting: Family Medicine

## 2017-10-22 DIAGNOSIS — Z1231 Encounter for screening mammogram for malignant neoplasm of breast: Secondary | ICD-10-CM

## 2017-12-18 DIAGNOSIS — L400 Psoriasis vulgaris: Secondary | ICD-10-CM | POA: Diagnosis not present

## 2018-02-18 DIAGNOSIS — R32 Unspecified urinary incontinence: Secondary | ICD-10-CM | POA: Diagnosis not present

## 2018-02-18 DIAGNOSIS — L309 Dermatitis, unspecified: Secondary | ICD-10-CM | POA: Diagnosis not present

## 2018-02-18 DIAGNOSIS — J309 Allergic rhinitis, unspecified: Secondary | ICD-10-CM | POA: Diagnosis not present

## 2018-02-18 DIAGNOSIS — Z1159 Encounter for screening for other viral diseases: Secondary | ICD-10-CM | POA: Diagnosis not present

## 2018-02-18 DIAGNOSIS — Z0000 Encounter for general adult medical examination without abnormal findings: Secondary | ICD-10-CM | POA: Diagnosis not present

## 2018-02-18 DIAGNOSIS — M79671 Pain in right foot: Secondary | ICD-10-CM | POA: Diagnosis not present

## 2018-02-18 DIAGNOSIS — I10 Essential (primary) hypertension: Secondary | ICD-10-CM | POA: Diagnosis not present

## 2018-02-18 DIAGNOSIS — E559 Vitamin D deficiency, unspecified: Secondary | ICD-10-CM | POA: Diagnosis not present

## 2018-02-18 DIAGNOSIS — R7301 Impaired fasting glucose: Secondary | ICD-10-CM | POA: Diagnosis not present

## 2018-02-18 DIAGNOSIS — Z1211 Encounter for screening for malignant neoplasm of colon: Secondary | ICD-10-CM | POA: Diagnosis not present

## 2018-02-18 DIAGNOSIS — E782 Mixed hyperlipidemia: Secondary | ICD-10-CM | POA: Diagnosis not present

## 2018-02-21 DIAGNOSIS — R7301 Impaired fasting glucose: Secondary | ICD-10-CM | POA: Diagnosis not present

## 2018-02-21 DIAGNOSIS — Z0000 Encounter for general adult medical examination without abnormal findings: Secondary | ICD-10-CM | POA: Diagnosis not present

## 2018-02-21 DIAGNOSIS — L409 Psoriasis, unspecified: Secondary | ICD-10-CM | POA: Diagnosis not present

## 2018-02-21 DIAGNOSIS — I10 Essential (primary) hypertension: Secondary | ICD-10-CM | POA: Diagnosis not present

## 2018-02-21 DIAGNOSIS — Z1389 Encounter for screening for other disorder: Secondary | ICD-10-CM | POA: Diagnosis not present

## 2018-02-21 DIAGNOSIS — J309 Allergic rhinitis, unspecified: Secondary | ICD-10-CM | POA: Diagnosis not present

## 2018-02-21 DIAGNOSIS — R600 Localized edema: Secondary | ICD-10-CM | POA: Diagnosis not present

## 2018-02-21 DIAGNOSIS — E782 Mixed hyperlipidemia: Secondary | ICD-10-CM | POA: Diagnosis not present

## 2018-02-21 DIAGNOSIS — E559 Vitamin D deficiency, unspecified: Secondary | ICD-10-CM | POA: Diagnosis not present

## 2018-08-19 DIAGNOSIS — E559 Vitamin D deficiency, unspecified: Secondary | ICD-10-CM | POA: Diagnosis not present

## 2018-08-19 DIAGNOSIS — L409 Psoriasis, unspecified: Secondary | ICD-10-CM | POA: Diagnosis not present

## 2018-08-19 DIAGNOSIS — E782 Mixed hyperlipidemia: Secondary | ICD-10-CM | POA: Diagnosis not present

## 2018-08-19 DIAGNOSIS — I10 Essential (primary) hypertension: Secondary | ICD-10-CM | POA: Diagnosis not present

## 2018-08-19 DIAGNOSIS — J309 Allergic rhinitis, unspecified: Secondary | ICD-10-CM | POA: Diagnosis not present

## 2018-08-19 DIAGNOSIS — R7301 Impaired fasting glucose: Secondary | ICD-10-CM | POA: Diagnosis not present

## 2018-08-26 DIAGNOSIS — Z6834 Body mass index (BMI) 34.0-34.9, adult: Secondary | ICD-10-CM | POA: Diagnosis not present

## 2018-08-26 DIAGNOSIS — E782 Mixed hyperlipidemia: Secondary | ICD-10-CM | POA: Diagnosis not present

## 2018-08-26 DIAGNOSIS — J301 Allergic rhinitis due to pollen: Secondary | ICD-10-CM | POA: Diagnosis not present

## 2018-08-26 DIAGNOSIS — E559 Vitamin D deficiency, unspecified: Secondary | ICD-10-CM | POA: Diagnosis not present

## 2018-08-26 DIAGNOSIS — J309 Allergic rhinitis, unspecified: Secondary | ICD-10-CM | POA: Diagnosis not present

## 2018-08-26 DIAGNOSIS — L409 Psoriasis, unspecified: Secondary | ICD-10-CM | POA: Diagnosis not present

## 2018-08-26 DIAGNOSIS — R7301 Impaired fasting glucose: Secondary | ICD-10-CM | POA: Diagnosis not present

## 2018-08-26 DIAGNOSIS — I10 Essential (primary) hypertension: Secondary | ICD-10-CM | POA: Diagnosis not present

## 2018-08-26 DIAGNOSIS — L309 Dermatitis, unspecified: Secondary | ICD-10-CM | POA: Diagnosis not present

## 2018-09-03 DIAGNOSIS — Z23 Encounter for immunization: Secondary | ICD-10-CM | POA: Diagnosis not present

## 2018-09-11 ENCOUNTER — Other Ambulatory Visit: Payer: Self-pay | Admitting: Family Medicine

## 2018-09-11 DIAGNOSIS — Z1231 Encounter for screening mammogram for malignant neoplasm of breast: Secondary | ICD-10-CM

## 2018-10-27 ENCOUNTER — Ambulatory Visit
Admission: RE | Admit: 2018-10-27 | Discharge: 2018-10-27 | Disposition: A | Discharge status: Home / Self Care | Payer: Medicare Other | Source: Ambulatory Visit | Attending: Family Medicine | Admitting: Family Medicine

## 2018-10-27 DIAGNOSIS — Z1231 Encounter for screening mammogram for malignant neoplasm of breast: Secondary | ICD-10-CM | POA: Diagnosis not present

## 2019-01-21 DIAGNOSIS — L821 Other seborrheic keratosis: Secondary | ICD-10-CM | POA: Diagnosis not present

## 2019-01-21 DIAGNOSIS — L400 Psoriasis vulgaris: Secondary | ICD-10-CM | POA: Diagnosis not present

## 2019-01-21 DIAGNOSIS — D225 Melanocytic nevi of trunk: Secondary | ICD-10-CM | POA: Diagnosis not present

## 2019-01-21 DIAGNOSIS — L570 Actinic keratosis: Secondary | ICD-10-CM | POA: Diagnosis not present

## 2019-01-21 DIAGNOSIS — D1801 Hemangioma of skin and subcutaneous tissue: Secondary | ICD-10-CM | POA: Diagnosis not present

## 2019-01-21 DIAGNOSIS — L853 Xerosis cutis: Secondary | ICD-10-CM | POA: Diagnosis not present

## 2019-03-26 DIAGNOSIS — L409 Psoriasis, unspecified: Secondary | ICD-10-CM | POA: Diagnosis not present

## 2019-03-26 DIAGNOSIS — E559 Vitamin D deficiency, unspecified: Secondary | ICD-10-CM | POA: Diagnosis not present

## 2019-03-26 DIAGNOSIS — J309 Allergic rhinitis, unspecified: Secondary | ICD-10-CM | POA: Diagnosis not present

## 2019-03-26 DIAGNOSIS — Z6834 Body mass index (BMI) 34.0-34.9, adult: Secondary | ICD-10-CM | POA: Diagnosis not present

## 2019-03-26 DIAGNOSIS — L309 Dermatitis, unspecified: Secondary | ICD-10-CM | POA: Diagnosis not present

## 2019-03-26 DIAGNOSIS — E782 Mixed hyperlipidemia: Secondary | ICD-10-CM | POA: Diagnosis not present

## 2019-03-26 DIAGNOSIS — I10 Essential (primary) hypertension: Secondary | ICD-10-CM | POA: Diagnosis not present

## 2019-03-26 DIAGNOSIS — R7301 Impaired fasting glucose: Secondary | ICD-10-CM | POA: Diagnosis not present

## 2019-04-07 DIAGNOSIS — R7301 Impaired fasting glucose: Secondary | ICD-10-CM | POA: Diagnosis not present

## 2019-04-07 DIAGNOSIS — E782 Mixed hyperlipidemia: Secondary | ICD-10-CM | POA: Diagnosis not present

## 2019-04-07 DIAGNOSIS — I10 Essential (primary) hypertension: Secondary | ICD-10-CM | POA: Diagnosis not present

## 2019-04-07 DIAGNOSIS — L409 Psoriasis, unspecified: Secondary | ICD-10-CM | POA: Diagnosis not present

## 2019-04-07 DIAGNOSIS — J309 Allergic rhinitis, unspecified: Secondary | ICD-10-CM | POA: Diagnosis not present

## 2019-04-07 DIAGNOSIS — E559 Vitamin D deficiency, unspecified: Secondary | ICD-10-CM | POA: Diagnosis not present

## 2019-04-09 DIAGNOSIS — I10 Essential (primary) hypertension: Secondary | ICD-10-CM | POA: Diagnosis not present

## 2019-04-09 DIAGNOSIS — M25369 Other instability, unspecified knee: Secondary | ICD-10-CM | POA: Diagnosis not present

## 2019-04-09 DIAGNOSIS — N959 Unspecified menopausal and perimenopausal disorder: Secondary | ICD-10-CM | POA: Diagnosis not present

## 2019-04-09 DIAGNOSIS — Z1389 Encounter for screening for other disorder: Secondary | ICD-10-CM | POA: Diagnosis not present

## 2019-04-09 DIAGNOSIS — L309 Dermatitis, unspecified: Secondary | ICD-10-CM | POA: Diagnosis not present

## 2019-04-09 DIAGNOSIS — J309 Allergic rhinitis, unspecified: Secondary | ICD-10-CM | POA: Diagnosis not present

## 2019-04-09 DIAGNOSIS — Z0000 Encounter for general adult medical examination without abnormal findings: Secondary | ICD-10-CM | POA: Diagnosis not present

## 2019-04-09 DIAGNOSIS — E559 Vitamin D deficiency, unspecified: Secondary | ICD-10-CM | POA: Diagnosis not present

## 2019-04-09 DIAGNOSIS — R7301 Impaired fasting glucose: Secondary | ICD-10-CM | POA: Diagnosis not present

## 2019-04-09 DIAGNOSIS — E782 Mixed hyperlipidemia: Secondary | ICD-10-CM | POA: Diagnosis not present

## 2019-07-08 DIAGNOSIS — E559 Vitamin D deficiency, unspecified: Secondary | ICD-10-CM | POA: Diagnosis not present

## 2019-08-20 DIAGNOSIS — M1710 Unilateral primary osteoarthritis, unspecified knee: Secondary | ICD-10-CM | POA: Diagnosis not present

## 2019-08-20 DIAGNOSIS — I10 Essential (primary) hypertension: Secondary | ICD-10-CM | POA: Diagnosis not present

## 2019-08-20 DIAGNOSIS — E782 Mixed hyperlipidemia: Secondary | ICD-10-CM | POA: Diagnosis not present

## 2019-08-22 DIAGNOSIS — Z23 Encounter for immunization: Secondary | ICD-10-CM | POA: Diagnosis not present

## 2019-09-21 ENCOUNTER — Other Ambulatory Visit: Payer: Self-pay | Admitting: Family Medicine

## 2019-09-21 DIAGNOSIS — Z1231 Encounter for screening mammogram for malignant neoplasm of breast: Secondary | ICD-10-CM

## 2019-10-02 DIAGNOSIS — E559 Vitamin D deficiency, unspecified: Secondary | ICD-10-CM | POA: Diagnosis not present

## 2019-10-02 DIAGNOSIS — I10 Essential (primary) hypertension: Secondary | ICD-10-CM | POA: Diagnosis not present

## 2019-10-02 DIAGNOSIS — E782 Mixed hyperlipidemia: Secondary | ICD-10-CM | POA: Diagnosis not present

## 2019-10-02 DIAGNOSIS — Z23 Encounter for immunization: Secondary | ICD-10-CM | POA: Diagnosis not present

## 2019-10-02 DIAGNOSIS — L309 Dermatitis, unspecified: Secondary | ICD-10-CM | POA: Diagnosis not present

## 2019-10-02 DIAGNOSIS — J309 Allergic rhinitis, unspecified: Secondary | ICD-10-CM | POA: Diagnosis not present

## 2019-10-02 DIAGNOSIS — R7303 Prediabetes: Secondary | ICD-10-CM | POA: Diagnosis not present

## 2019-11-10 ENCOUNTER — Ambulatory Visit
Admission: RE | Admit: 2019-11-10 | Discharge: 2019-11-10 | Disposition: A | Discharge status: Home / Self Care | Payer: Medicare Other | Source: Ambulatory Visit | Attending: Family Medicine | Admitting: Family Medicine

## 2019-11-10 ENCOUNTER — Other Ambulatory Visit: Payer: Self-pay

## 2019-11-10 DIAGNOSIS — Z1231 Encounter for screening mammogram for malignant neoplasm of breast: Secondary | ICD-10-CM

## 2020-01-09 ENCOUNTER — Ambulatory Visit: Payer: Medicare Other | Attending: Internal Medicine

## 2020-01-09 DIAGNOSIS — Z23 Encounter for immunization: Secondary | ICD-10-CM | POA: Insufficient documentation

## 2020-01-09 NOTE — Progress Notes (Signed)
   Covid-19 Vaccination Clinic  Name:  Jenny Hayes    MRN: [id number] DOB: [birth year]  01/09/2020  [name] was observed post Covid-19 immunization for 15 minutes without incidence. She was provided with Vaccine Information Sheet and instruction to access the V-Safe system.   [name] was instructed to call 911 with any severe reactions post vaccine: Marland Kitchen Difficulty breathing  . Swelling of your face and throat  . A fast heartbeat  . A bad rash all over your body  . Dizziness and weakness    Immunizations Administered    Name Date Dose VIS Date Route   Pfizer COVID-19 Vaccine 01/09/2020  8:58 AM 0.3 mL 11/06/2019 Intramuscular   Manufacturer: [place]   Lot: [id number]   [place]: [id number]

## 2020-01-31 ENCOUNTER — Ambulatory Visit: Payer: Medicare Other | Attending: Internal Medicine

## 2020-01-31 DIAGNOSIS — Z23 Encounter for immunization: Secondary | ICD-10-CM | POA: Insufficient documentation

## 2020-01-31 NOTE — Progress Notes (Signed)
   Covid-19 Vaccination Clinic  Name:  Jenny Hayes    MRN: [id number] DOB: 06-24-1946  01/31/2020  Jenny Hayes was observed post Covid-19 immunization for 15 minutes without incident. She was provided with Vaccine Information Sheet and instruction to access the V-Safe system.   Jenny Hayes was instructed to call 911 with any severe reactions post vaccine: Marland Kitchen Difficulty breathing  . Swelling of face and throat  . A fast heartbeat  . A bad rash all over body  . Dizziness and weakness   Immunizations Administered    Name Date Dose VIS Date Route   Pfizer COVID-19 Vaccine 01/31/2020 11:39 AM 0.3 mL 11/06/2019 Intramuscular   Manufacturer: [place]   Lot: [id number]   [place]: [id number]

## 2020-02-15 DIAGNOSIS — I10 Essential (primary) hypertension: Secondary | ICD-10-CM | POA: Diagnosis not present

## 2020-02-15 DIAGNOSIS — M1710 Unilateral primary osteoarthritis, unspecified knee: Secondary | ICD-10-CM | POA: Diagnosis not present

## 2020-02-15 DIAGNOSIS — E782 Mixed hyperlipidemia: Secondary | ICD-10-CM | POA: Diagnosis not present

## 2020-03-08 DIAGNOSIS — D224 Melanocytic nevi of scalp and neck: Secondary | ICD-10-CM | POA: Diagnosis not present

## 2020-03-08 DIAGNOSIS — L820 Inflamed seborrheic keratosis: Secondary | ICD-10-CM | POA: Diagnosis not present

## 2020-03-08 DIAGNOSIS — D225 Melanocytic nevi of trunk: Secondary | ICD-10-CM | POA: Diagnosis not present

## 2020-03-08 DIAGNOSIS — L853 Xerosis cutis: Secondary | ICD-10-CM | POA: Diagnosis not present

## 2020-03-08 DIAGNOSIS — D1801 Hemangioma of skin and subcutaneous tissue: Secondary | ICD-10-CM | POA: Diagnosis not present

## 2020-03-08 DIAGNOSIS — L821 Other seborrheic keratosis: Secondary | ICD-10-CM | POA: Diagnosis not present

## 2020-03-08 DIAGNOSIS — L304 Erythema intertrigo: Secondary | ICD-10-CM | POA: Diagnosis not present

## 2020-03-08 DIAGNOSIS — L918 Other hypertrophic disorders of the skin: Secondary | ICD-10-CM | POA: Diagnosis not present

## 2020-04-15 DIAGNOSIS — Z0000 Encounter for general adult medical examination without abnormal findings: Secondary | ICD-10-CM | POA: Diagnosis not present

## 2020-04-15 DIAGNOSIS — M545 Low back pain: Secondary | ICD-10-CM | POA: Diagnosis not present

## 2020-04-15 DIAGNOSIS — E559 Vitamin D deficiency, unspecified: Secondary | ICD-10-CM | POA: Diagnosis not present

## 2020-04-15 DIAGNOSIS — E669 Obesity, unspecified: Secondary | ICD-10-CM | POA: Diagnosis not present

## 2020-04-15 DIAGNOSIS — I10 Essential (primary) hypertension: Secondary | ICD-10-CM | POA: Diagnosis not present

## 2020-04-15 DIAGNOSIS — E782 Mixed hyperlipidemia: Secondary | ICD-10-CM | POA: Diagnosis not present

## 2020-04-15 DIAGNOSIS — Z01419 Encounter for gynecological examination (general) (routine) without abnormal findings: Secondary | ICD-10-CM | POA: Diagnosis not present

## 2020-04-15 DIAGNOSIS — Z1389 Encounter for screening for other disorder: Secondary | ICD-10-CM | POA: Diagnosis not present

## 2020-04-15 DIAGNOSIS — H9190 Unspecified hearing loss, unspecified ear: Secondary | ICD-10-CM | POA: Diagnosis not present

## 2020-04-15 DIAGNOSIS — R7309 Other abnormal glucose: Secondary | ICD-10-CM | POA: Diagnosis not present

## 2020-04-29 DIAGNOSIS — R7309 Other abnormal glucose: Secondary | ICD-10-CM | POA: Diagnosis not present

## 2020-04-29 DIAGNOSIS — L309 Dermatitis, unspecified: Secondary | ICD-10-CM | POA: Diagnosis not present

## 2020-04-29 DIAGNOSIS — Z0000 Encounter for general adult medical examination without abnormal findings: Secondary | ICD-10-CM | POA: Diagnosis not present

## 2020-04-29 DIAGNOSIS — E782 Mixed hyperlipidemia: Secondary | ICD-10-CM | POA: Diagnosis not present

## 2020-04-29 DIAGNOSIS — E559 Vitamin D deficiency, unspecified: Secondary | ICD-10-CM | POA: Diagnosis not present

## 2020-04-29 DIAGNOSIS — I10 Essential (primary) hypertension: Secondary | ICD-10-CM | POA: Diagnosis not present

## 2020-06-22 DIAGNOSIS — M1710 Unilateral primary osteoarthritis, unspecified knee: Secondary | ICD-10-CM | POA: Diagnosis not present

## 2020-06-22 DIAGNOSIS — E782 Mixed hyperlipidemia: Secondary | ICD-10-CM | POA: Diagnosis not present

## 2020-06-22 DIAGNOSIS — I10 Essential (primary) hypertension: Secondary | ICD-10-CM | POA: Diagnosis not present

## 2020-07-01 DIAGNOSIS — I10 Essential (primary) hypertension: Secondary | ICD-10-CM | POA: Diagnosis not present

## 2020-07-01 DIAGNOSIS — R7309 Other abnormal glucose: Secondary | ICD-10-CM | POA: Diagnosis not present

## 2020-07-01 DIAGNOSIS — E559 Vitamin D deficiency, unspecified: Secondary | ICD-10-CM | POA: Diagnosis not present

## 2020-07-01 DIAGNOSIS — L309 Dermatitis, unspecified: Secondary | ICD-10-CM | POA: Diagnosis not present

## 2020-07-01 DIAGNOSIS — E782 Mixed hyperlipidemia: Secondary | ICD-10-CM | POA: Diagnosis not present

## 2020-07-26 DIAGNOSIS — E782 Mixed hyperlipidemia: Secondary | ICD-10-CM | POA: Diagnosis not present

## 2020-07-26 DIAGNOSIS — M1710 Unilateral primary osteoarthritis, unspecified knee: Secondary | ICD-10-CM | POA: Diagnosis not present

## 2020-07-26 DIAGNOSIS — I10 Essential (primary) hypertension: Secondary | ICD-10-CM | POA: Diagnosis not present

## 2020-08-25 DIAGNOSIS — M1710 Unilateral primary osteoarthritis, unspecified knee: Secondary | ICD-10-CM | POA: Diagnosis not present

## 2020-08-25 DIAGNOSIS — I10 Essential (primary) hypertension: Secondary | ICD-10-CM | POA: Diagnosis not present

## 2020-08-25 DIAGNOSIS — E782 Mixed hyperlipidemia: Secondary | ICD-10-CM | POA: Diagnosis not present

## 2020-09-10 DIAGNOSIS — Z23 Encounter for immunization: Secondary | ICD-10-CM | POA: Diagnosis not present

## 2020-09-29 ENCOUNTER — Other Ambulatory Visit: Payer: Self-pay | Admitting: Family Medicine

## 2020-09-29 DIAGNOSIS — Z1231 Encounter for screening mammogram for malignant neoplasm of breast: Secondary | ICD-10-CM

## 2020-10-25 DIAGNOSIS — M1710 Unilateral primary osteoarthritis, unspecified knee: Secondary | ICD-10-CM | POA: Diagnosis not present

## 2020-10-25 DIAGNOSIS — I10 Essential (primary) hypertension: Secondary | ICD-10-CM | POA: Diagnosis not present

## 2020-10-25 DIAGNOSIS — E782 Mixed hyperlipidemia: Secondary | ICD-10-CM | POA: Diagnosis not present

## 2020-11-09 ENCOUNTER — Ambulatory Visit: Payer: Medicare Other

## 2020-11-09 ENCOUNTER — Ambulatory Visit
Admission: RE | Admit: 2020-11-09 | Discharge: 2020-11-09 | Disposition: A | Discharge status: Home / Self Care | Payer: Medicare Other | Source: Ambulatory Visit | Attending: Family Medicine | Admitting: Family Medicine

## 2020-11-09 ENCOUNTER — Other Ambulatory Visit: Payer: Self-pay

## 2020-11-09 DIAGNOSIS — Z1231 Encounter for screening mammogram for malignant neoplasm of breast: Secondary | ICD-10-CM | POA: Diagnosis not present

## 2021-01-19 DIAGNOSIS — M1710 Unilateral primary osteoarthritis, unspecified knee: Secondary | ICD-10-CM | POA: Diagnosis not present

## 2021-01-19 DIAGNOSIS — E782 Mixed hyperlipidemia: Secondary | ICD-10-CM | POA: Diagnosis not present

## 2021-01-19 DIAGNOSIS — I10 Essential (primary) hypertension: Secondary | ICD-10-CM | POA: Diagnosis not present

## 2021-03-02 DIAGNOSIS — M1710 Unilateral primary osteoarthritis, unspecified knee: Secondary | ICD-10-CM | POA: Diagnosis not present

## 2021-03-02 DIAGNOSIS — I10 Essential (primary) hypertension: Secondary | ICD-10-CM | POA: Diagnosis not present

## 2021-03-02 DIAGNOSIS — E782 Mixed hyperlipidemia: Secondary | ICD-10-CM | POA: Diagnosis not present

## 2021-03-04 IMAGING — MG DIGITAL SCREENING BILAT W/ TOMO W/ CAD
8 series · 8 of 24 positions shown · non-contrast
Comparison: Previous exam(s).

CLINICAL DATA: Screening.

EXAM:
DIGITAL SCREENING BILATERAL MAMMOGRAM WITH TOMO AND CAD

[R CC synth-2D]
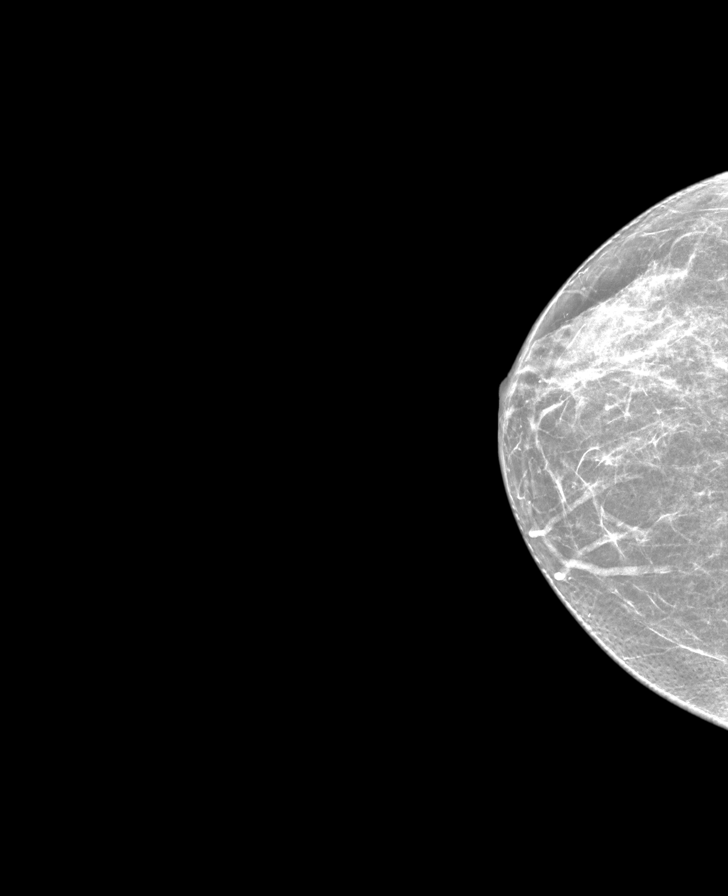

[R MLO synth-2D]
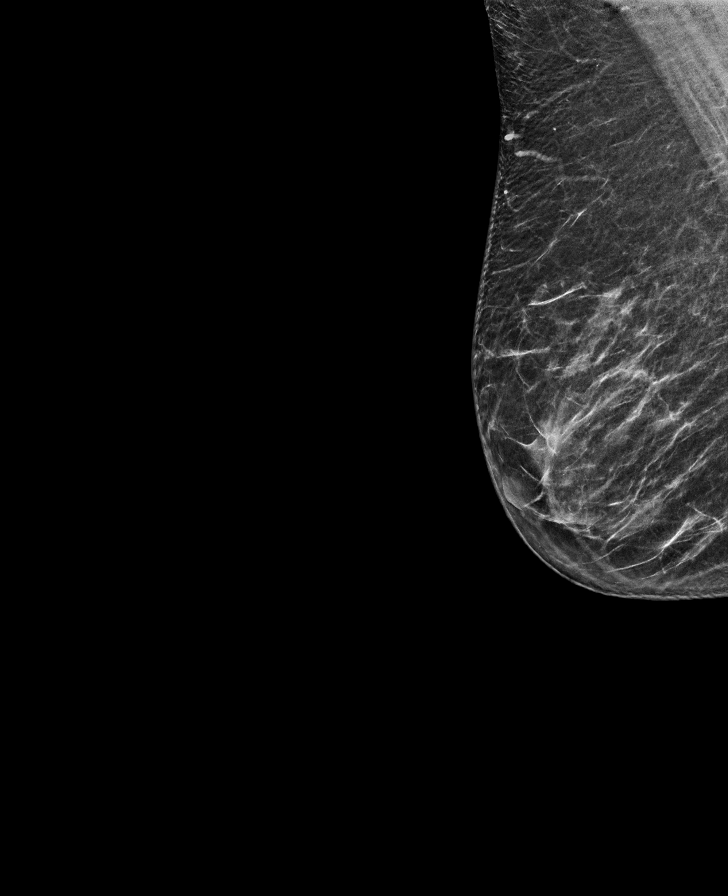

[L MLO synth-2D]
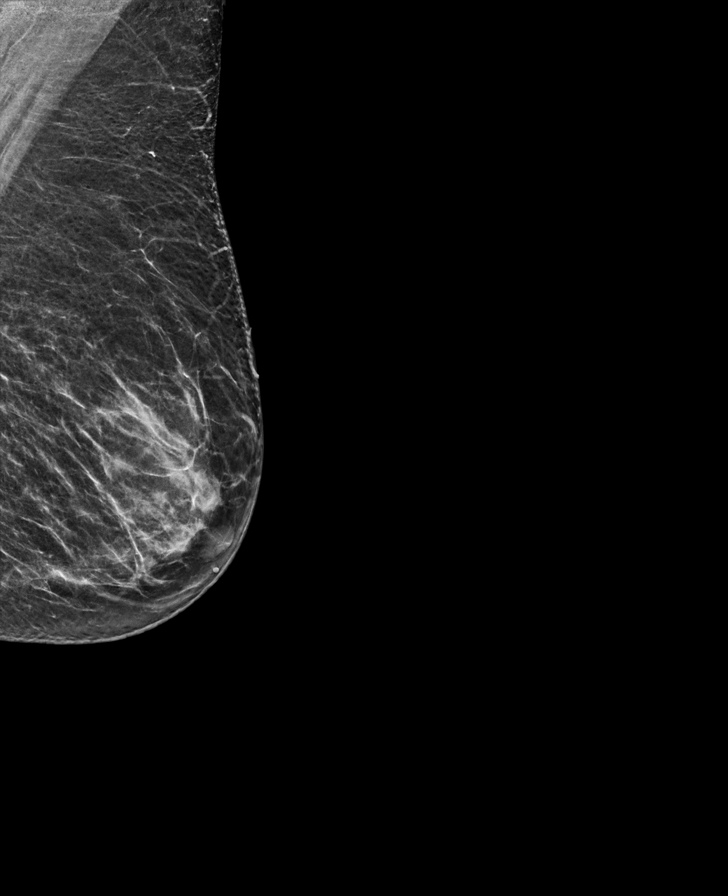

[L CC synth-2D]
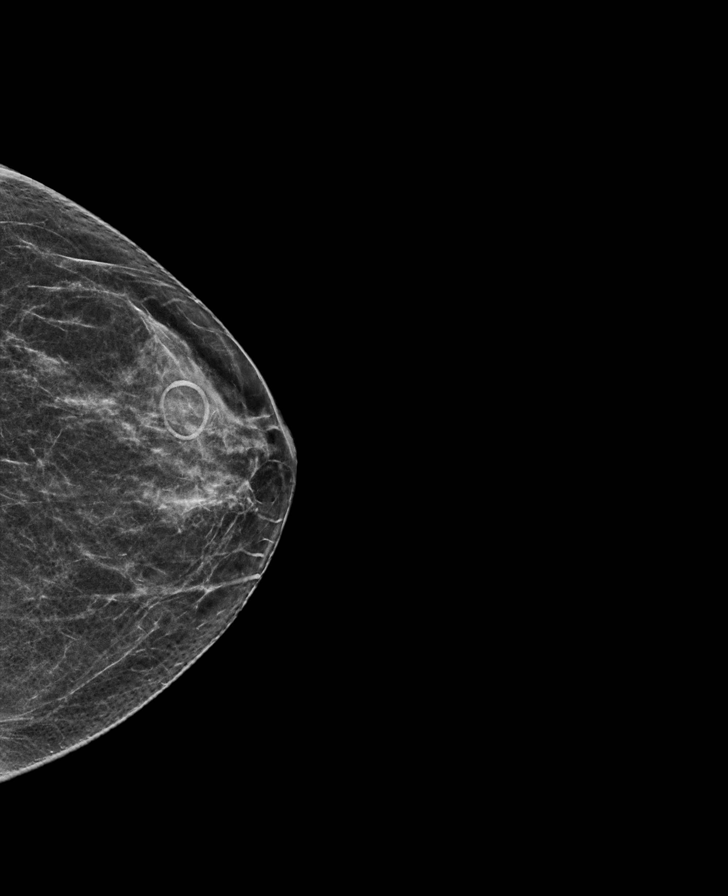

[L MLO tomo · tomo slice 31/60.0]
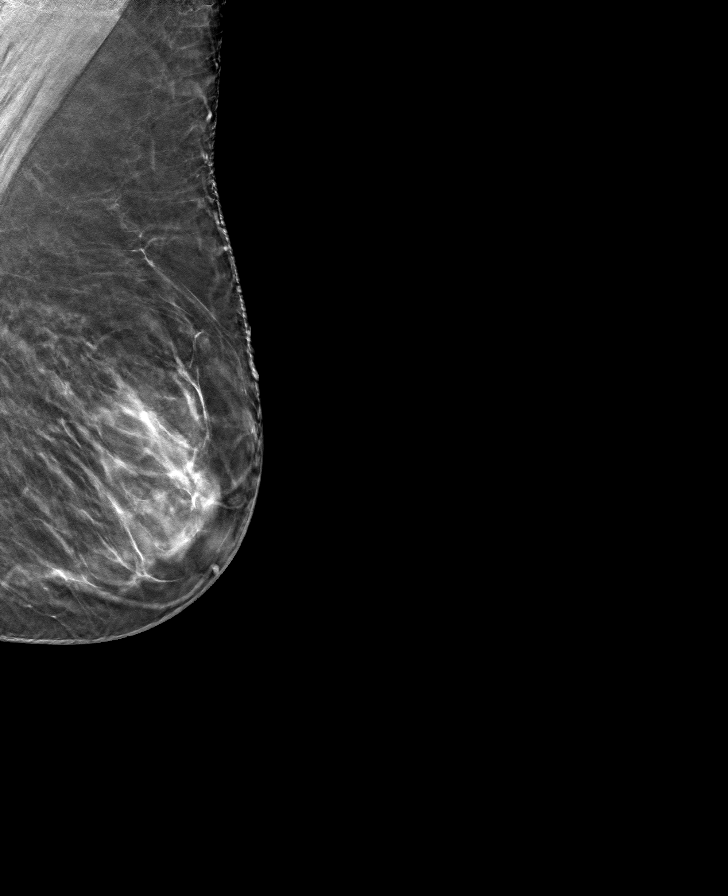

[L CC tomo · tomo slice 28/55.0]
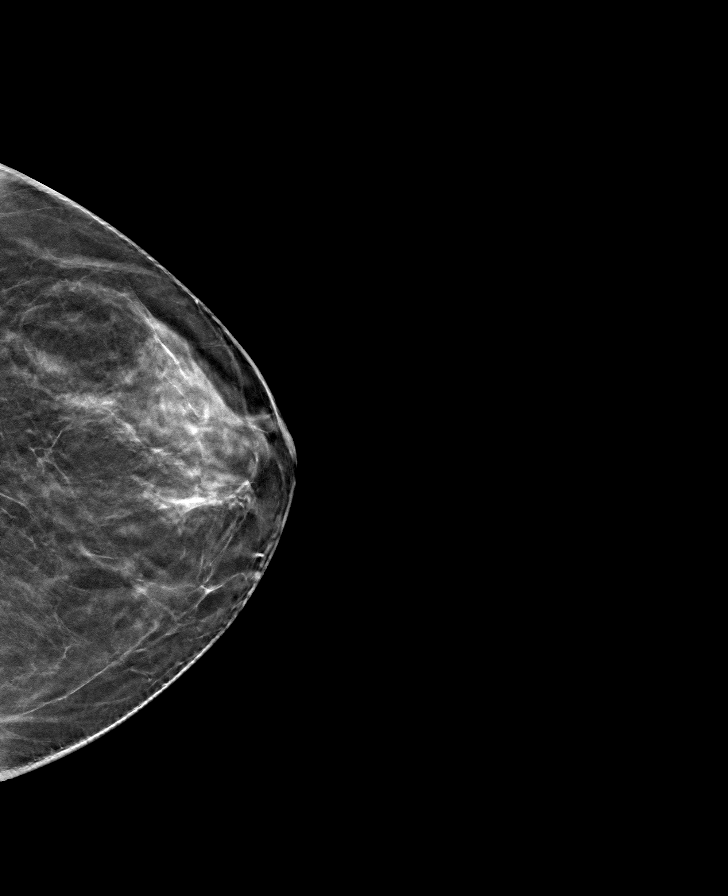

[R MLO tomo · tomo slice 30/59.0]
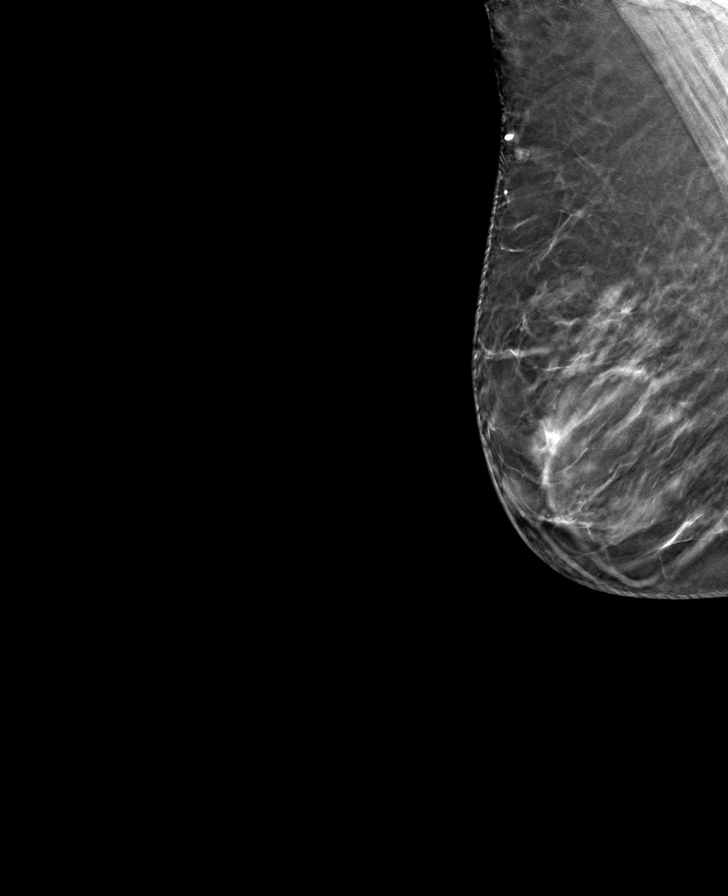

[R CC tomo · tomo slice 27/54.0]
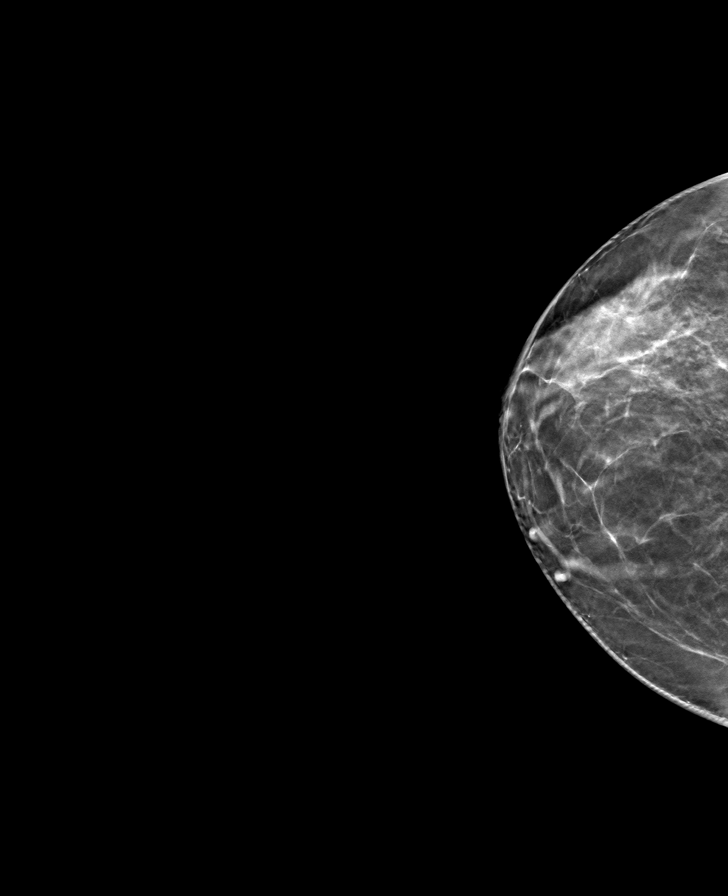

[8 of 24 positions shown; findings below may reference images not displayed]

ACR Breast Density Category c: The breast tissue is heterogeneously
dense, which may obscure small masses.
FINDINGS: There are no findings suspicious for malignancy. Images were
processed with CAD.
IMPRESSION: No mammographic evidence of malignancy. A result letter of this
screening mammogram will be mailed directly to the patient.

RECOMMENDATION:
Screening mammogram in one year. (Code:[id number])

BI-RADS CATEGORY  1: Negative.

## 2021-03-21 DIAGNOSIS — R7303 Prediabetes: Secondary | ICD-10-CM | POA: Diagnosis not present

## 2021-03-21 DIAGNOSIS — R7309 Other abnormal glucose: Secondary | ICD-10-CM | POA: Diagnosis not present

## 2021-03-21 DIAGNOSIS — I10 Essential (primary) hypertension: Secondary | ICD-10-CM | POA: Diagnosis not present

## 2021-03-21 DIAGNOSIS — E782 Mixed hyperlipidemia: Secondary | ICD-10-CM | POA: Diagnosis not present

## 2021-03-21 DIAGNOSIS — E559 Vitamin D deficiency, unspecified: Secondary | ICD-10-CM | POA: Diagnosis not present

## 2021-03-21 DIAGNOSIS — L309 Dermatitis, unspecified: Secondary | ICD-10-CM | POA: Diagnosis not present

## 2021-04-07 DIAGNOSIS — L723 Sebaceous cyst: Secondary | ICD-10-CM | POA: Diagnosis not present

## 2021-04-07 DIAGNOSIS — L821 Other seborrheic keratosis: Secondary | ICD-10-CM | POA: Diagnosis not present

## 2021-04-07 DIAGNOSIS — C4441 Basal cell carcinoma of skin of scalp and neck: Secondary | ICD-10-CM | POA: Diagnosis not present

## 2021-04-07 DIAGNOSIS — L304 Erythema intertrigo: Secondary | ICD-10-CM | POA: Diagnosis not present

## 2021-04-14 DIAGNOSIS — L309 Dermatitis, unspecified: Secondary | ICD-10-CM | POA: Diagnosis not present

## 2021-04-14 DIAGNOSIS — E782 Mixed hyperlipidemia: Secondary | ICD-10-CM | POA: Diagnosis not present

## 2021-04-14 DIAGNOSIS — E559 Vitamin D deficiency, unspecified: Secondary | ICD-10-CM | POA: Diagnosis not present

## 2021-04-14 DIAGNOSIS — R5381 Other malaise: Secondary | ICD-10-CM | POA: Diagnosis not present

## 2021-04-14 DIAGNOSIS — M542 Cervicalgia: Secondary | ICD-10-CM | POA: Diagnosis not present

## 2021-04-14 DIAGNOSIS — J309 Allergic rhinitis, unspecified: Secondary | ICD-10-CM | POA: Diagnosis not present

## 2021-04-14 DIAGNOSIS — R7301 Impaired fasting glucose: Secondary | ICD-10-CM | POA: Diagnosis not present

## 2021-04-14 DIAGNOSIS — I10 Essential (primary) hypertension: Secondary | ICD-10-CM | POA: Diagnosis not present

## 2021-05-01 DIAGNOSIS — B351 Tinea unguium: Secondary | ICD-10-CM | POA: Diagnosis not present

## 2021-05-01 DIAGNOSIS — Z1389 Encounter for screening for other disorder: Secondary | ICD-10-CM | POA: Diagnosis not present

## 2021-05-01 DIAGNOSIS — E559 Vitamin D deficiency, unspecified: Secondary | ICD-10-CM | POA: Diagnosis not present

## 2021-05-01 DIAGNOSIS — E2839 Other primary ovarian failure: Secondary | ICD-10-CM | POA: Diagnosis not present

## 2021-05-01 DIAGNOSIS — Z7189 Other specified counseling: Secondary | ICD-10-CM | POA: Diagnosis not present

## 2021-05-01 DIAGNOSIS — R7301 Impaired fasting glucose: Secondary | ICD-10-CM | POA: Diagnosis not present

## 2021-05-01 DIAGNOSIS — I10 Essential (primary) hypertension: Secondary | ICD-10-CM | POA: Diagnosis not present

## 2021-05-01 DIAGNOSIS — L309 Dermatitis, unspecified: Secondary | ICD-10-CM | POA: Diagnosis not present

## 2021-05-01 DIAGNOSIS — E782 Mixed hyperlipidemia: Secondary | ICD-10-CM | POA: Diagnosis not present

## 2021-05-01 DIAGNOSIS — Z79899 Other long term (current) drug therapy: Secondary | ICD-10-CM | POA: Diagnosis not present

## 2021-05-01 DIAGNOSIS — Z0000 Encounter for general adult medical examination without abnormal findings: Secondary | ICD-10-CM | POA: Diagnosis not present

## 2021-05-01 DIAGNOSIS — J309 Allergic rhinitis, unspecified: Secondary | ICD-10-CM | POA: Diagnosis not present

## 2021-05-02 ENCOUNTER — Other Ambulatory Visit: Payer: Self-pay | Admitting: Family Medicine

## 2021-05-02 DIAGNOSIS — E2839 Other primary ovarian failure: Secondary | ICD-10-CM

## 2021-05-08 DIAGNOSIS — Z85828 Personal history of other malignant neoplasm of skin: Secondary | ICD-10-CM | POA: Diagnosis not present

## 2021-05-08 DIAGNOSIS — C44319 Basal cell carcinoma of skin of other parts of face: Secondary | ICD-10-CM | POA: Diagnosis not present

## 2021-05-19 ENCOUNTER — Other Ambulatory Visit: Payer: Self-pay | Admitting: Family Medicine

## 2021-05-19 DIAGNOSIS — Z1231 Encounter for screening mammogram for malignant neoplasm of breast: Secondary | ICD-10-CM

## 2021-06-01 DIAGNOSIS — J309 Allergic rhinitis, unspecified: Secondary | ICD-10-CM | POA: Diagnosis not present

## 2021-06-01 DIAGNOSIS — Z7189 Other specified counseling: Secondary | ICD-10-CM | POA: Diagnosis not present

## 2021-06-01 DIAGNOSIS — B351 Tinea unguium: Secondary | ICD-10-CM | POA: Diagnosis not present

## 2021-06-01 DIAGNOSIS — Z79899 Other long term (current) drug therapy: Secondary | ICD-10-CM | POA: Diagnosis not present

## 2021-06-01 DIAGNOSIS — R7301 Impaired fasting glucose: Secondary | ICD-10-CM | POA: Diagnosis not present

## 2021-06-01 DIAGNOSIS — Z1389 Encounter for screening for other disorder: Secondary | ICD-10-CM | POA: Diagnosis not present

## 2021-06-01 DIAGNOSIS — I10 Essential (primary) hypertension: Secondary | ICD-10-CM | POA: Diagnosis not present

## 2021-06-01 DIAGNOSIS — E782 Mixed hyperlipidemia: Secondary | ICD-10-CM | POA: Diagnosis not present

## 2021-06-01 DIAGNOSIS — L309 Dermatitis, unspecified: Secondary | ICD-10-CM | POA: Diagnosis not present

## 2021-06-01 DIAGNOSIS — E559 Vitamin D deficiency, unspecified: Secondary | ICD-10-CM | POA: Diagnosis not present

## 2021-06-01 DIAGNOSIS — E2839 Other primary ovarian failure: Secondary | ICD-10-CM | POA: Diagnosis not present

## 2021-06-01 DIAGNOSIS — Z0000 Encounter for general adult medical examination without abnormal findings: Secondary | ICD-10-CM | POA: Diagnosis not present

## 2021-06-06 DIAGNOSIS — Z85828 Personal history of other malignant neoplasm of skin: Secondary | ICD-10-CM | POA: Diagnosis not present

## 2021-06-06 DIAGNOSIS — L304 Erythema intertrigo: Secondary | ICD-10-CM | POA: Diagnosis not present

## 2021-07-05 DIAGNOSIS — M1710 Unilateral primary osteoarthritis, unspecified knee: Secondary | ICD-10-CM | POA: Diagnosis not present

## 2021-07-05 DIAGNOSIS — E782 Mixed hyperlipidemia: Secondary | ICD-10-CM | POA: Diagnosis not present

## 2021-07-05 DIAGNOSIS — I10 Essential (primary) hypertension: Secondary | ICD-10-CM | POA: Diagnosis not present

## 2021-09-14 DIAGNOSIS — Z23 Encounter for immunization: Secondary | ICD-10-CM | POA: Diagnosis not present

## 2021-10-13 DIAGNOSIS — E782 Mixed hyperlipidemia: Secondary | ICD-10-CM | POA: Diagnosis not present

## 2021-10-13 DIAGNOSIS — I10 Essential (primary) hypertension: Secondary | ICD-10-CM | POA: Diagnosis not present

## 2021-10-13 DIAGNOSIS — M1710 Unilateral primary osteoarthritis, unspecified knee: Secondary | ICD-10-CM | POA: Diagnosis not present

## 2021-10-24 DIAGNOSIS — R7309 Other abnormal glucose: Secondary | ICD-10-CM | POA: Diagnosis not present

## 2021-10-24 DIAGNOSIS — E559 Vitamin D deficiency, unspecified: Secondary | ICD-10-CM | POA: Diagnosis not present

## 2021-10-24 DIAGNOSIS — R7301 Impaired fasting glucose: Secondary | ICD-10-CM | POA: Diagnosis not present

## 2021-11-13 ENCOUNTER — Inpatient Hospital Stay: Admission: RE | Admit: 2021-11-13 | Payer: Medicare Other | Source: Ambulatory Visit

## 2021-11-13 ENCOUNTER — Other Ambulatory Visit: Payer: Medicare Other

## 2021-11-13 ENCOUNTER — Ambulatory Visit: Payer: Medicare Other

## 2021-11-14 DIAGNOSIS — E559 Vitamin D deficiency, unspecified: Secondary | ICD-10-CM | POA: Diagnosis not present

## 2021-11-14 DIAGNOSIS — J309 Allergic rhinitis, unspecified: Secondary | ICD-10-CM | POA: Diagnosis not present

## 2021-11-14 DIAGNOSIS — I10 Essential (primary) hypertension: Secondary | ICD-10-CM | POA: Diagnosis not present

## 2021-11-14 DIAGNOSIS — L309 Dermatitis, unspecified: Secondary | ICD-10-CM | POA: Diagnosis not present

## 2021-11-14 DIAGNOSIS — R7301 Impaired fasting glucose: Secondary | ICD-10-CM | POA: Diagnosis not present

## 2021-11-14 DIAGNOSIS — E785 Hyperlipidemia, unspecified: Secondary | ICD-10-CM | POA: Diagnosis not present

## 2021-11-14 DIAGNOSIS — B351 Tinea unguium: Secondary | ICD-10-CM | POA: Diagnosis not present

## 2021-12-05 ENCOUNTER — Ambulatory Visit
Admission: RE | Admit: 2021-12-05 | Discharge: 2021-12-05 | Disposition: A | Discharge status: Home / Self Care | Payer: Medicare Other | Source: Ambulatory Visit | Attending: Family Medicine | Admitting: Family Medicine

## 2021-12-05 DIAGNOSIS — Z1231 Encounter for screening mammogram for malignant neoplasm of breast: Secondary | ICD-10-CM

## 2022-02-21 DIAGNOSIS — S59901A Unspecified injury of right elbow, initial encounter: Secondary | ICD-10-CM | POA: Diagnosis not present

## 2022-02-21 DIAGNOSIS — S6991XA Unspecified injury of right wrist, hand and finger(s), initial encounter: Secondary | ICD-10-CM | POA: Diagnosis not present

## 2022-02-21 DIAGNOSIS — S5291XA Unspecified fracture of right forearm, initial encounter for closed fracture: Secondary | ICD-10-CM | POA: Diagnosis not present

## 2022-02-22 DIAGNOSIS — S52201A Unspecified fracture of shaft of right ulna, initial encounter for closed fracture: Secondary | ICD-10-CM | POA: Diagnosis not present

## 2022-02-22 DIAGNOSIS — S52501A Unspecified fracture of the lower end of right radius, initial encounter for closed fracture: Secondary | ICD-10-CM | POA: Diagnosis not present

## 2022-03-01 DIAGNOSIS — S52264A Nondisplaced segmental fracture of shaft of ulna, right arm, initial encounter for closed fracture: Secondary | ICD-10-CM | POA: Diagnosis not present

## 2022-03-01 DIAGNOSIS — S52521A Torus fracture of lower end of right radius, initial encounter for closed fracture: Secondary | ICD-10-CM | POA: Diagnosis not present

## 2022-03-01 DIAGNOSIS — M25531 Pain in right wrist: Secondary | ICD-10-CM | POA: Diagnosis not present

## 2022-03-01 DIAGNOSIS — S42494A Other nondisplaced fracture of lower end of right humerus, initial encounter for closed fracture: Secondary | ICD-10-CM | POA: Diagnosis not present

## 2022-03-15 DIAGNOSIS — S42494A Other nondisplaced fracture of lower end of right humerus, initial encounter for closed fracture: Secondary | ICD-10-CM | POA: Diagnosis not present

## 2022-03-15 DIAGNOSIS — S52521A Torus fracture of lower end of right radius, initial encounter for closed fracture: Secondary | ICD-10-CM | POA: Diagnosis not present

## 2022-03-15 DIAGNOSIS — S52264A Nondisplaced segmental fracture of shaft of ulna, right arm, initial encounter for closed fracture: Secondary | ICD-10-CM | POA: Diagnosis not present

## 2022-03-15 DIAGNOSIS — M25531 Pain in right wrist: Secondary | ICD-10-CM | POA: Diagnosis not present

## 2022-03-29 DIAGNOSIS — S52264A Nondisplaced segmental fracture of shaft of ulna, right arm, initial encounter for closed fracture: Secondary | ICD-10-CM | POA: Diagnosis not present

## 2022-03-29 DIAGNOSIS — S52521A Torus fracture of lower end of right radius, initial encounter for closed fracture: Secondary | ICD-10-CM | POA: Diagnosis not present

## 2022-03-29 DIAGNOSIS — S42494A Other nondisplaced fracture of lower end of right humerus, initial encounter for closed fracture: Secondary | ICD-10-CM | POA: Diagnosis not present

## 2022-04-03 DIAGNOSIS — M25621 Stiffness of right elbow, not elsewhere classified: Secondary | ICD-10-CM | POA: Diagnosis not present

## 2022-04-06 DIAGNOSIS — M25621 Stiffness of right elbow, not elsewhere classified: Secondary | ICD-10-CM | POA: Diagnosis not present

## 2022-04-06 DIAGNOSIS — M79641 Pain in right hand: Secondary | ICD-10-CM | POA: Diagnosis not present

## 2022-04-10 DIAGNOSIS — M79641 Pain in right hand: Secondary | ICD-10-CM | POA: Diagnosis not present

## 2022-04-10 DIAGNOSIS — M25621 Stiffness of right elbow, not elsewhere classified: Secondary | ICD-10-CM | POA: Diagnosis not present

## 2022-04-13 DIAGNOSIS — M25621 Stiffness of right elbow, not elsewhere classified: Secondary | ICD-10-CM | POA: Diagnosis not present

## 2022-04-13 DIAGNOSIS — M79641 Pain in right hand: Secondary | ICD-10-CM | POA: Diagnosis not present

## 2022-04-17 DIAGNOSIS — M25621 Stiffness of right elbow, not elsewhere classified: Secondary | ICD-10-CM | POA: Diagnosis not present

## 2022-04-19 DIAGNOSIS — S42494A Other nondisplaced fracture of lower end of right humerus, initial encounter for closed fracture: Secondary | ICD-10-CM | POA: Diagnosis not present

## 2022-04-19 DIAGNOSIS — M79641 Pain in right hand: Secondary | ICD-10-CM | POA: Diagnosis not present

## 2022-04-19 DIAGNOSIS — S52264A Nondisplaced segmental fracture of shaft of ulna, right arm, initial encounter for closed fracture: Secondary | ICD-10-CM | POA: Diagnosis not present

## 2022-04-19 DIAGNOSIS — M25621 Stiffness of right elbow, not elsewhere classified: Secondary | ICD-10-CM | POA: Diagnosis not present

## 2022-04-19 DIAGNOSIS — S52521A Torus fracture of lower end of right radius, initial encounter for closed fracture: Secondary | ICD-10-CM | POA: Diagnosis not present

## 2022-04-24 DIAGNOSIS — M79641 Pain in right hand: Secondary | ICD-10-CM | POA: Diagnosis not present

## 2022-04-24 DIAGNOSIS — M25621 Stiffness of right elbow, not elsewhere classified: Secondary | ICD-10-CM | POA: Diagnosis not present

## 2022-04-27 DIAGNOSIS — M25621 Stiffness of right elbow, not elsewhere classified: Secondary | ICD-10-CM | POA: Diagnosis not present

## 2022-04-27 DIAGNOSIS — M79641 Pain in right hand: Secondary | ICD-10-CM | POA: Diagnosis not present

## 2022-05-01 DIAGNOSIS — M79641 Pain in right hand: Secondary | ICD-10-CM | POA: Diagnosis not present

## 2022-05-01 DIAGNOSIS — M25621 Stiffness of right elbow, not elsewhere classified: Secondary | ICD-10-CM | POA: Diagnosis not present

## 2022-05-02 DIAGNOSIS — E559 Vitamin D deficiency, unspecified: Secondary | ICD-10-CM | POA: Diagnosis not present

## 2022-05-02 DIAGNOSIS — I10 Essential (primary) hypertension: Secondary | ICD-10-CM | POA: Diagnosis not present

## 2022-05-02 DIAGNOSIS — Z1389 Encounter for screening for other disorder: Secondary | ICD-10-CM | POA: Diagnosis not present

## 2022-05-02 DIAGNOSIS — Z0000 Encounter for general adult medical examination without abnormal findings: Secondary | ICD-10-CM | POA: Diagnosis not present

## 2022-05-02 DIAGNOSIS — E782 Mixed hyperlipidemia: Secondary | ICD-10-CM | POA: Diagnosis not present

## 2022-05-02 DIAGNOSIS — R7301 Impaired fasting glucose: Secondary | ICD-10-CM | POA: Diagnosis not present

## 2022-05-04 DIAGNOSIS — M79641 Pain in right hand: Secondary | ICD-10-CM | POA: Diagnosis not present

## 2022-05-04 DIAGNOSIS — M25621 Stiffness of right elbow, not elsewhere classified: Secondary | ICD-10-CM | POA: Diagnosis not present

## 2022-05-07 DIAGNOSIS — H6123 Impacted cerumen, bilateral: Secondary | ICD-10-CM | POA: Diagnosis not present

## 2022-05-07 DIAGNOSIS — R7301 Impaired fasting glucose: Secondary | ICD-10-CM | POA: Diagnosis not present

## 2022-05-07 DIAGNOSIS — H9193 Unspecified hearing loss, bilateral: Secondary | ICD-10-CM | POA: Diagnosis not present

## 2022-05-07 DIAGNOSIS — E559 Vitamin D deficiency, unspecified: Secondary | ICD-10-CM | POA: Diagnosis not present

## 2022-05-07 DIAGNOSIS — R001 Bradycardia, unspecified: Secondary | ICD-10-CM | POA: Diagnosis not present

## 2022-05-07 DIAGNOSIS — J309 Allergic rhinitis, unspecified: Secondary | ICD-10-CM | POA: Diagnosis not present

## 2022-05-07 DIAGNOSIS — L309 Dermatitis, unspecified: Secondary | ICD-10-CM | POA: Diagnosis not present

## 2022-05-07 DIAGNOSIS — E782 Mixed hyperlipidemia: Secondary | ICD-10-CM | POA: Diagnosis not present

## 2022-05-07 DIAGNOSIS — B372 Candidiasis of skin and nail: Secondary | ICD-10-CM | POA: Diagnosis not present

## 2022-05-07 DIAGNOSIS — L409 Psoriasis, unspecified: Secondary | ICD-10-CM | POA: Diagnosis not present

## 2022-05-07 DIAGNOSIS — I10 Essential (primary) hypertension: Secondary | ICD-10-CM | POA: Diagnosis not present

## 2022-05-08 DIAGNOSIS — M79641 Pain in right hand: Secondary | ICD-10-CM | POA: Diagnosis not present

## 2022-05-08 DIAGNOSIS — M25621 Stiffness of right elbow, not elsewhere classified: Secondary | ICD-10-CM | POA: Diagnosis not present

## 2022-05-10 DIAGNOSIS — M25621 Stiffness of right elbow, not elsewhere classified: Secondary | ICD-10-CM | POA: Diagnosis not present

## 2022-05-15 DIAGNOSIS — M25621 Stiffness of right elbow, not elsewhere classified: Secondary | ICD-10-CM | POA: Diagnosis not present

## 2022-05-15 DIAGNOSIS — M79641 Pain in right hand: Secondary | ICD-10-CM | POA: Diagnosis not present

## 2022-05-17 DIAGNOSIS — M79641 Pain in right hand: Secondary | ICD-10-CM | POA: Diagnosis not present

## 2022-05-17 DIAGNOSIS — S42494A Other nondisplaced fracture of lower end of right humerus, initial encounter for closed fracture: Secondary | ICD-10-CM | POA: Diagnosis not present

## 2022-05-17 DIAGNOSIS — S52264A Nondisplaced segmental fracture of shaft of ulna, right arm, initial encounter for closed fracture: Secondary | ICD-10-CM | POA: Diagnosis not present

## 2022-05-17 DIAGNOSIS — M25621 Stiffness of right elbow, not elsewhere classified: Secondary | ICD-10-CM | POA: Diagnosis not present

## 2022-05-17 DIAGNOSIS — S52521A Torus fracture of lower end of right radius, initial encounter for closed fracture: Secondary | ICD-10-CM | POA: Diagnosis not present

## 2022-05-21 DIAGNOSIS — M25621 Stiffness of right elbow, not elsewhere classified: Secondary | ICD-10-CM | POA: Diagnosis not present

## 2022-05-21 DIAGNOSIS — M79641 Pain in right hand: Secondary | ICD-10-CM | POA: Diagnosis not present

## 2022-05-25 DIAGNOSIS — M25621 Stiffness of right elbow, not elsewhere classified: Secondary | ICD-10-CM | POA: Diagnosis not present

## 2022-05-25 DIAGNOSIS — M79641 Pain in right hand: Secondary | ICD-10-CM | POA: Diagnosis not present

## 2022-05-28 DIAGNOSIS — M25621 Stiffness of right elbow, not elsewhere classified: Secondary | ICD-10-CM | POA: Diagnosis not present

## 2022-05-28 DIAGNOSIS — M79641 Pain in right hand: Secondary | ICD-10-CM | POA: Diagnosis not present

## 2022-05-31 DIAGNOSIS — M25621 Stiffness of right elbow, not elsewhere classified: Secondary | ICD-10-CM | POA: Diagnosis not present

## 2022-06-08 DIAGNOSIS — M79641 Pain in right hand: Secondary | ICD-10-CM | POA: Diagnosis not present

## 2022-06-08 DIAGNOSIS — M25621 Stiffness of right elbow, not elsewhere classified: Secondary | ICD-10-CM | POA: Diagnosis not present

## 2022-06-14 DIAGNOSIS — M79641 Pain in right hand: Secondary | ICD-10-CM | POA: Diagnosis not present

## 2022-06-14 DIAGNOSIS — S52264A Nondisplaced segmental fracture of shaft of ulna, right arm, initial encounter for closed fracture: Secondary | ICD-10-CM | POA: Diagnosis not present

## 2022-06-14 DIAGNOSIS — M25621 Stiffness of right elbow, not elsewhere classified: Secondary | ICD-10-CM | POA: Diagnosis not present

## 2022-06-21 DIAGNOSIS — H18233 Secondary corneal edema, bilateral: Secondary | ICD-10-CM | POA: Diagnosis not present

## 2022-06-21 DIAGNOSIS — H18513 Endothelial corneal dystrophy, bilateral: Secondary | ICD-10-CM | POA: Diagnosis not present

## 2022-06-22 DIAGNOSIS — M79641 Pain in right hand: Secondary | ICD-10-CM | POA: Diagnosis not present

## 2022-06-22 DIAGNOSIS — M25621 Stiffness of right elbow, not elsewhere classified: Secondary | ICD-10-CM | POA: Diagnosis not present

## 2022-06-26 DIAGNOSIS — M25621 Stiffness of right elbow, not elsewhere classified: Secondary | ICD-10-CM | POA: Diagnosis not present

## 2022-07-06 DIAGNOSIS — M79641 Pain in right hand: Secondary | ICD-10-CM | POA: Diagnosis not present

## 2022-07-06 DIAGNOSIS — M25621 Stiffness of right elbow, not elsewhere classified: Secondary | ICD-10-CM | POA: Diagnosis not present

## 2022-07-12 DIAGNOSIS — M79641 Pain in right hand: Secondary | ICD-10-CM | POA: Diagnosis not present

## 2022-07-12 DIAGNOSIS — M25621 Stiffness of right elbow, not elsewhere classified: Secondary | ICD-10-CM | POA: Diagnosis not present

## 2022-07-16 DIAGNOSIS — M79641 Pain in right hand: Secondary | ICD-10-CM | POA: Diagnosis not present

## 2022-07-16 DIAGNOSIS — M25621 Stiffness of right elbow, not elsewhere classified: Secondary | ICD-10-CM | POA: Diagnosis not present

## 2022-07-24 DIAGNOSIS — H18233 Secondary corneal edema, bilateral: Secondary | ICD-10-CM | POA: Diagnosis not present

## 2022-07-24 DIAGNOSIS — H18513 Endothelial corneal dystrophy, bilateral: Secondary | ICD-10-CM | POA: Diagnosis not present

## 2022-07-26 DIAGNOSIS — M25621 Stiffness of right elbow, not elsewhere classified: Secondary | ICD-10-CM | POA: Diagnosis not present

## 2022-08-16 DIAGNOSIS — S52264A Nondisplaced segmental fracture of shaft of ulna, right arm, initial encounter for closed fracture: Secondary | ICD-10-CM | POA: Diagnosis not present

## 2022-08-16 DIAGNOSIS — S52521A Torus fracture of lower end of right radius, initial encounter for closed fracture: Secondary | ICD-10-CM | POA: Diagnosis not present

## 2022-08-16 DIAGNOSIS — S42494A Other nondisplaced fracture of lower end of right humerus, initial encounter for closed fracture: Secondary | ICD-10-CM | POA: Diagnosis not present

## 2022-10-26 DIAGNOSIS — Z23 Encounter for immunization: Secondary | ICD-10-CM | POA: Diagnosis not present

## 2022-11-08 DIAGNOSIS — S4991XA Unspecified injury of right shoulder and upper arm, initial encounter: Secondary | ICD-10-CM | POA: Diagnosis not present

## 2022-11-08 DIAGNOSIS — S43004A Unspecified dislocation of right shoulder joint, initial encounter: Secondary | ICD-10-CM | POA: Diagnosis not present

## 2022-11-16 DIAGNOSIS — S4991XD Unspecified injury of right shoulder and upper arm, subsequent encounter: Secondary | ICD-10-CM | POA: Diagnosis not present

## 2022-11-16 DIAGNOSIS — Z23 Encounter for immunization: Secondary | ICD-10-CM | POA: Diagnosis not present

## 2022-11-16 DIAGNOSIS — J309 Allergic rhinitis, unspecified: Secondary | ICD-10-CM | POA: Diagnosis not present

## 2022-11-16 DIAGNOSIS — I10 Essential (primary) hypertension: Secondary | ICD-10-CM | POA: Diagnosis not present

## 2022-11-16 DIAGNOSIS — B372 Candidiasis of skin and nail: Secondary | ICD-10-CM | POA: Diagnosis not present

## 2022-11-16 DIAGNOSIS — R7301 Impaired fasting glucose: Secondary | ICD-10-CM | POA: Diagnosis not present

## 2022-11-16 DIAGNOSIS — E559 Vitamin D deficiency, unspecified: Secondary | ICD-10-CM | POA: Diagnosis not present

## 2022-11-16 DIAGNOSIS — N3946 Mixed incontinence: Secondary | ICD-10-CM | POA: Diagnosis not present

## 2022-11-16 DIAGNOSIS — L309 Dermatitis, unspecified: Secondary | ICD-10-CM | POA: Diagnosis not present

## 2022-11-16 DIAGNOSIS — Z6832 Body mass index (BMI) 32.0-32.9, adult: Secondary | ICD-10-CM | POA: Diagnosis not present

## 2022-11-16 DIAGNOSIS — E782 Mixed hyperlipidemia: Secondary | ICD-10-CM | POA: Diagnosis not present

## 2022-11-20 DIAGNOSIS — S52521A Torus fracture of lower end of right radius, initial encounter for closed fracture: Secondary | ICD-10-CM | POA: Diagnosis not present

## 2022-11-20 DIAGNOSIS — S42494A Other nondisplaced fracture of lower end of right humerus, initial encounter for closed fracture: Secondary | ICD-10-CM | POA: Diagnosis not present

## 2022-11-20 DIAGNOSIS — M25511 Pain in right shoulder: Secondary | ICD-10-CM | POA: Diagnosis not present

## 2022-11-20 DIAGNOSIS — S52264A Nondisplaced segmental fracture of shaft of ulna, right arm, initial encounter for closed fracture: Secondary | ICD-10-CM | POA: Diagnosis not present

## 2022-12-11 DIAGNOSIS — M25511 Pain in right shoulder: Secondary | ICD-10-CM | POA: Diagnosis not present

## 2023-01-08 DIAGNOSIS — M25511 Pain in right shoulder: Secondary | ICD-10-CM | POA: Diagnosis not present

## 2023-02-19 DIAGNOSIS — M25511 Pain in right shoulder: Secondary | ICD-10-CM | POA: Diagnosis not present

## 2023-03-05 DIAGNOSIS — S43004D Unspecified dislocation of right shoulder joint, subsequent encounter: Secondary | ICD-10-CM | POA: Diagnosis not present

## 2023-03-15 DIAGNOSIS — S43004D Unspecified dislocation of right shoulder joint, subsequent encounter: Secondary | ICD-10-CM | POA: Diagnosis not present

## 2023-03-19 DIAGNOSIS — M25511 Pain in right shoulder: Secondary | ICD-10-CM | POA: Diagnosis not present

## 2023-03-20 DIAGNOSIS — S43004D Unspecified dislocation of right shoulder joint, subsequent encounter: Secondary | ICD-10-CM | POA: Diagnosis not present

## 2023-04-02 DIAGNOSIS — S43004D Unspecified dislocation of right shoulder joint, subsequent encounter: Secondary | ICD-10-CM | POA: Diagnosis not present

## 2023-04-10 DIAGNOSIS — S43004D Unspecified dislocation of right shoulder joint, subsequent encounter: Secondary | ICD-10-CM | POA: Diagnosis not present

## 2023-04-18 DIAGNOSIS — M25511 Pain in right shoulder: Secondary | ICD-10-CM | POA: Diagnosis not present

## 2023-04-25 DIAGNOSIS — M25511 Pain in right shoulder: Secondary | ICD-10-CM | POA: Diagnosis not present

## 2023-04-30 DIAGNOSIS — M25511 Pain in right shoulder: Secondary | ICD-10-CM | POA: Diagnosis not present

## 2023-05-06 DIAGNOSIS — Z0000 Encounter for general adult medical examination without abnormal findings: Secondary | ICD-10-CM | POA: Diagnosis not present

## 2023-05-06 DIAGNOSIS — E669 Obesity, unspecified: Secondary | ICD-10-CM | POA: Diagnosis not present

## 2023-05-06 DIAGNOSIS — Z1389 Encounter for screening for other disorder: Secondary | ICD-10-CM | POA: Diagnosis not present

## 2023-05-13 DIAGNOSIS — S43004D Unspecified dislocation of right shoulder joint, subsequent encounter: Secondary | ICD-10-CM | POA: Diagnosis not present

## 2023-05-14 DIAGNOSIS — M25511 Pain in right shoulder: Secondary | ICD-10-CM | POA: Diagnosis not present

## 2023-06-10 DIAGNOSIS — R7301 Impaired fasting glucose: Secondary | ICD-10-CM | POA: Diagnosis not present

## 2023-06-10 DIAGNOSIS — E782 Mixed hyperlipidemia: Secondary | ICD-10-CM | POA: Diagnosis not present

## 2023-06-10 DIAGNOSIS — E559 Vitamin D deficiency, unspecified: Secondary | ICD-10-CM | POA: Diagnosis not present

## 2023-06-11 DIAGNOSIS — M25511 Pain in right shoulder: Secondary | ICD-10-CM | POA: Diagnosis not present

## 2023-06-12 ENCOUNTER — Other Ambulatory Visit: Payer: Self-pay | Admitting: Pain Medicine

## 2023-06-12 DIAGNOSIS — J309 Allergic rhinitis, unspecified: Secondary | ICD-10-CM | POA: Diagnosis not present

## 2023-06-12 DIAGNOSIS — Z6832 Body mass index (BMI) 32.0-32.9, adult: Secondary | ICD-10-CM | POA: Diagnosis not present

## 2023-06-12 DIAGNOSIS — E782 Mixed hyperlipidemia: Secondary | ICD-10-CM | POA: Diagnosis not present

## 2023-06-12 DIAGNOSIS — I10 Essential (primary) hypertension: Secondary | ICD-10-CM | POA: Diagnosis not present

## 2023-06-12 DIAGNOSIS — L409 Psoriasis, unspecified: Secondary | ICD-10-CM | POA: Diagnosis not present

## 2023-06-12 DIAGNOSIS — W57XXXA Bitten or stung by nonvenomous insect and other nonvenomous arthropods, initial encounter: Secondary | ICD-10-CM | POA: Diagnosis not present

## 2023-06-12 DIAGNOSIS — Z780 Asymptomatic menopausal state: Secondary | ICD-10-CM | POA: Diagnosis not present

## 2023-06-12 DIAGNOSIS — S70361A Insect bite (nonvenomous), right thigh, initial encounter: Secondary | ICD-10-CM | POA: Diagnosis not present

## 2023-06-12 DIAGNOSIS — Z1239 Encounter for other screening for malignant neoplasm of breast: Secondary | ICD-10-CM | POA: Diagnosis not present

## 2023-06-12 DIAGNOSIS — E559 Vitamin D deficiency, unspecified: Secondary | ICD-10-CM | POA: Diagnosis not present

## 2023-06-12 DIAGNOSIS — R7303 Prediabetes: Secondary | ICD-10-CM | POA: Diagnosis not present

## 2023-06-13 ENCOUNTER — Other Ambulatory Visit: Payer: Self-pay | Admitting: Pain Medicine

## 2023-06-13 DIAGNOSIS — Z1231 Encounter for screening mammogram for malignant neoplasm of breast: Secondary | ICD-10-CM

## 2023-07-24 ENCOUNTER — Ambulatory Visit
Admission: RE | Admit: 2023-07-24 | Discharge: 2023-07-24 | Disposition: A | Discharge status: Home / Self Care | Payer: Medicare Other | Source: Ambulatory Visit | Attending: Pain Medicine | Admitting: Pain Medicine

## 2023-07-24 DIAGNOSIS — Z1231 Encounter for screening mammogram for malignant neoplasm of breast: Secondary | ICD-10-CM

## 2023-07-30 DIAGNOSIS — S43004D Unspecified dislocation of right shoulder joint, subsequent encounter: Secondary | ICD-10-CM | POA: Diagnosis not present

## 2023-07-30 DIAGNOSIS — M25511 Pain in right shoulder: Secondary | ICD-10-CM | POA: Diagnosis not present

## 2023-10-11 DIAGNOSIS — Z23 Encounter for immunization: Secondary | ICD-10-CM | POA: Diagnosis not present

## 2024-05-12 DIAGNOSIS — Z85828 Personal history of other malignant neoplasm of skin: Secondary | ICD-10-CM | POA: Diagnosis not present

## 2024-05-12 DIAGNOSIS — L304 Erythema intertrigo: Secondary | ICD-10-CM | POA: Diagnosis not present

## 2024-05-12 DIAGNOSIS — L820 Inflamed seborrheic keratosis: Secondary | ICD-10-CM | POA: Diagnosis not present

## 2024-05-12 DIAGNOSIS — L853 Xerosis cutis: Secondary | ICD-10-CM | POA: Diagnosis not present

## 2024-05-25 DIAGNOSIS — E782 Mixed hyperlipidemia: Secondary | ICD-10-CM | POA: Diagnosis not present

## 2024-05-25 DIAGNOSIS — E2839 Other primary ovarian failure: Secondary | ICD-10-CM | POA: Diagnosis not present

## 2024-05-25 DIAGNOSIS — Z0000 Encounter for general adult medical examination without abnormal findings: Secondary | ICD-10-CM | POA: Diagnosis not present

## 2024-05-25 DIAGNOSIS — Z1331 Encounter for screening for depression: Secondary | ICD-10-CM | POA: Diagnosis not present

## 2024-05-25 DIAGNOSIS — Z6832 Body mass index (BMI) 32.0-32.9, adult: Secondary | ICD-10-CM | POA: Diagnosis not present

## 2024-05-27 ENCOUNTER — Other Ambulatory Visit (HOSPITAL_BASED_OUTPATIENT_CLINIC_OR_DEPARTMENT_OTHER): Payer: Self-pay | Admitting: Physician Assistant

## 2024-05-27 DIAGNOSIS — E2839 Other primary ovarian failure: Secondary | ICD-10-CM

## 2024-07-01 DIAGNOSIS — L304 Erythema intertrigo: Secondary | ICD-10-CM | POA: Diagnosis not present

## 2024-07-01 DIAGNOSIS — L308 Other specified dermatitis: Secondary | ICD-10-CM | POA: Diagnosis not present

## 2024-07-01 DIAGNOSIS — L723 Sebaceous cyst: Secondary | ICD-10-CM | POA: Diagnosis not present

## 2024-07-01 DIAGNOSIS — Z85828 Personal history of other malignant neoplasm of skin: Secondary | ICD-10-CM | POA: Diagnosis not present

## 2024-07-28 DIAGNOSIS — Z1231 Encounter for screening mammogram for malignant neoplasm of breast: Secondary | ICD-10-CM | POA: Diagnosis not present

## 2024-07-29 DIAGNOSIS — L309 Dermatitis, unspecified: Secondary | ICD-10-CM | POA: Diagnosis not present

## 2024-07-29 DIAGNOSIS — L304 Erythema intertrigo: Secondary | ICD-10-CM | POA: Diagnosis not present

## 2024-07-29 DIAGNOSIS — Z85828 Personal history of other malignant neoplasm of skin: Secondary | ICD-10-CM | POA: Diagnosis not present

## 2024-09-08 DIAGNOSIS — Z6832 Body mass index (BMI) 32.0-32.9, adult: Secondary | ICD-10-CM | POA: Diagnosis not present

## 2024-09-08 DIAGNOSIS — E559 Vitamin D deficiency, unspecified: Secondary | ICD-10-CM | POA: Diagnosis not present

## 2024-09-08 DIAGNOSIS — R7303 Prediabetes: Secondary | ICD-10-CM | POA: Diagnosis not present

## 2024-09-08 DIAGNOSIS — I10 Essential (primary) hypertension: Secondary | ICD-10-CM | POA: Diagnosis not present

## 2024-09-08 DIAGNOSIS — E782 Mixed hyperlipidemia: Secondary | ICD-10-CM | POA: Diagnosis not present

## 2024-09-24 DIAGNOSIS — Z23 Encounter for immunization: Secondary | ICD-10-CM | POA: Diagnosis not present
